# Patient Record
Sex: Male | Born: 1960 | Race: White | Hispanic: No | Marital: Single | Smoking: Current every day smoker
Health system: Southern US, Community
[De-identification: ages and names within clinical notes are randomized; demographics above are authoritative.]

## PROBLEM LIST (undated history)

## (undated) DIAGNOSIS — R519 Headache, unspecified: Secondary | ICD-10-CM

## (undated) DIAGNOSIS — I10 Essential (primary) hypertension: Secondary | ICD-10-CM

## (undated) DIAGNOSIS — L8990 Pressure ulcer of unspecified site, unspecified stage: Secondary | ICD-10-CM

## (undated) DIAGNOSIS — I2510 Atherosclerotic heart disease of native coronary artery without angina pectoris: Secondary | ICD-10-CM

## (undated) DIAGNOSIS — H538 Other visual disturbances: Secondary | ICD-10-CM

## (undated) DIAGNOSIS — Z95828 Presence of other vascular implants and grafts: Secondary | ICD-10-CM

## (undated) HISTORY — DX: Pressure ulcer of unspecified site, unspecified stage: L89.90

## (undated) HISTORY — DX: Presence of other vascular implants and grafts: Z95.828

## (undated) HISTORY — DX: Other visual disturbances: H53.8

## (undated) HISTORY — DX: Headache, unspecified: R51.9

---

## 2010-07-22 ENCOUNTER — Emergency Department: Payer: Self-pay | Admitting: Emergency Medicine

## 2018-06-19 ENCOUNTER — Encounter (HOSPITAL_COMMUNITY): Payer: Self-pay

## 2018-06-19 ENCOUNTER — Emergency Department (HOSPITAL_COMMUNITY): Payer: Medicaid Other | Admitting: Anesthesiology

## 2018-06-19 ENCOUNTER — Emergency Department (HOSPITAL_COMMUNITY): Payer: Medicaid Other

## 2018-06-19 ENCOUNTER — Encounter (HOSPITAL_COMMUNITY)
Admission: EM | Disposition: A | Discharge status: Home / Self Care | Payer: Self-pay | Source: Home / Self Care | Attending: Cardiothoracic Surgery

## 2018-06-19 ENCOUNTER — Inpatient Hospital Stay (HOSPITAL_COMMUNITY)
Admission: EM | Admit: 2018-06-19 | Discharge: 2018-06-28 | DRG: 220 | Disposition: A | Discharge status: Home / Self Care | Payer: Medicaid Other | Attending: Cardiothoracic Surgery | Admitting: Cardiothoracic Surgery

## 2018-06-19 ENCOUNTER — Other Ambulatory Visit: Payer: Self-pay

## 2018-06-19 DIAGNOSIS — I2510 Atherosclerotic heart disease of native coronary artery without angina pectoris: Secondary | ICD-10-CM | POA: Diagnosis present

## 2018-06-19 DIAGNOSIS — Z9689 Presence of other specified functional implants: Secondary | ICD-10-CM

## 2018-06-19 DIAGNOSIS — Z95828 Presence of other vascular implants and grafts: Secondary | ICD-10-CM

## 2018-06-19 DIAGNOSIS — I7101 Dissection of thoracic aorta: Secondary | ICD-10-CM

## 2018-06-19 DIAGNOSIS — G8311 Monoplegia of lower limb affecting right dominant side: Secondary | ICD-10-CM | POA: Diagnosis present

## 2018-06-19 DIAGNOSIS — I959 Hypotension, unspecified: Secondary | ICD-10-CM | POA: Diagnosis present

## 2018-06-19 DIAGNOSIS — F17200 Nicotine dependence, unspecified, uncomplicated: Secondary | ICD-10-CM | POA: Diagnosis present

## 2018-06-19 DIAGNOSIS — G8314 Monoplegia of lower limb affecting left nondominant side: Secondary | ICD-10-CM | POA: Diagnosis present

## 2018-06-19 DIAGNOSIS — D6959 Other secondary thrombocytopenia: Secondary | ICD-10-CM | POA: Diagnosis not present

## 2018-06-19 DIAGNOSIS — L8990 Pressure ulcer of unspecified site, unspecified stage: Secondary | ICD-10-CM

## 2018-06-19 DIAGNOSIS — Z09 Encounter for follow-up examination after completed treatment for conditions other than malignant neoplasm: Secondary | ICD-10-CM

## 2018-06-19 DIAGNOSIS — I10 Essential (primary) hypertension: Secondary | ICD-10-CM | POA: Diagnosis present

## 2018-06-19 DIAGNOSIS — D62 Acute posthemorrhagic anemia: Secondary | ICD-10-CM | POA: Diagnosis not present

## 2018-06-19 DIAGNOSIS — I351 Nonrheumatic aortic (valve) insufficiency: Secondary | ICD-10-CM | POA: Diagnosis present

## 2018-06-19 DIAGNOSIS — I7103 Dissection of thoracoabdominal aorta: Principal | ICD-10-CM | POA: Diagnosis present

## 2018-06-19 DIAGNOSIS — J9811 Atelectasis: Secondary | ICD-10-CM | POA: Diagnosis present

## 2018-06-19 HISTORY — PX: REPLACEMENT ASCENDING AORTA: SHX6068

## 2018-06-19 HISTORY — DX: Essential (primary) hypertension: I10

## 2018-06-19 HISTORY — PX: CORONARY ARTERY BYPASS GRAFT: SHX141

## 2018-06-19 HISTORY — DX: Atherosclerotic heart disease of native coronary artery without angina pectoris: I25.10

## 2018-06-19 HISTORY — PX: INTRAOPERATIVE TRANSESOPHAGEAL ECHOCARDIOGRAM: SHX5062

## 2018-06-19 LAB — BASIC METABOLIC PANEL
Anion gap: 16 — ABNORMAL HIGH (ref 5–15)
BUN: 11 mg/dL (ref 6–20)
CALCIUM: 9 mg/dL (ref 8.9–10.3)
CO2: 18 mmol/L — ABNORMAL LOW (ref 22–32)
CREATININE: 1.58 mg/dL — AB (ref 0.61–1.24)
Chloride: 106 mmol/L (ref 98–111)
GFR calc non Af Amer: 47 mL/min — ABNORMAL LOW (ref >60)
GFR, EST AFRICAN AMERICAN: 54 mL/min — AB (ref >60)
GLUCOSE: 131 mg/dL — AB (ref 70–99)
Potassium: 3.4 mmol/L — ABNORMAL LOW (ref 3.5–5.1)
Sodium: 140 mmol/L (ref 135–145)

## 2018-06-19 LAB — CBC
HCT: 48.1 % (ref 39.0–52.0)
Hemoglobin: 16 g/dL (ref 13.0–17.0)
MCH: 31.1 pg (ref 26.0–34.0)
MCHC: 33.3 g/dL (ref 30.0–36.0)
MCV: 93.6 fL (ref 78.0–100.0)
PLATELETS: 227 10*3/uL (ref 150–400)
RBC: 5.14 MIL/uL (ref 4.22–5.81)
RDW: 12.5 % (ref 11.5–15.5)
WBC: 13.2 10*3/uL — ABNORMAL HIGH (ref 4.0–10.5)

## 2018-06-19 LAB — I-STAT CHEM 8, ED
BUN: 12 mg/dL (ref 6–20)
CALCIUM ION: 1.03 mmol/L — AB (ref 1.15–1.40)
CREATININE: 1.5 mg/dL — AB (ref 0.61–1.24)
Chloride: 108 mmol/L (ref 98–111)
GLUCOSE: 129 mg/dL — AB (ref 70–99)
HCT: 47 % (ref 39.0–52.0)
Hemoglobin: 16 g/dL (ref 13.0–17.0)
Potassium: 3.5 mmol/L (ref 3.5–5.1)
Sodium: 142 mmol/L (ref 135–145)
TCO2: 20 mmol/L — AB (ref 22–32)

## 2018-06-19 LAB — HEPATIC FUNCTION PANEL
ALT: 10 U/L (ref 0–44)
AST: 19 U/L (ref 15–41)
Albumin: 3.7 g/dL (ref 3.5–5.0)
Alkaline Phosphatase: 79 U/L (ref 38–126)
BILIRUBIN DIRECT: 0.2 mg/dL (ref 0.0–0.2)
BILIRUBIN INDIRECT: 0.5 mg/dL (ref 0.3–0.9)
BILIRUBIN TOTAL: 0.7 mg/dL (ref 0.3–1.2)
Total Protein: 6.4 g/dL — ABNORMAL LOW (ref 6.5–8.1)

## 2018-06-19 LAB — PROTIME-INR
INR: 1.11
Prothrombin Time: 14.2 seconds (ref 11.4–15.2)

## 2018-06-19 LAB — ABO/RH: ABO/RH(D): O POS

## 2018-06-19 LAB — I-STAT TROPONIN, ED: TROPONIN I, POC: 0.01 ng/mL (ref 0.00–0.08)

## 2018-06-19 SURGERY — REPLACEMENT, AORTA, ASCENDING
Anesthesia: General | Site: Esophagus

## 2018-06-19 MED ORDER — ONDANSETRON HCL 4 MG/2ML IJ SOLN
INTRAMUSCULAR | Status: AC
  Filled 2018-06-19: qty 2

## 2018-06-19 MED ORDER — HEMOSTATIC AGENTS (NO CHARGE) OPTIME
TOPICAL | Status: DC | PRN
  Administered 2018-06-19: 2 via TOPICAL
  Administered 2018-06-19 (×2): 1 via TOPICAL
  Administered 2018-06-19: 3 via TOPICAL
  Administered 2018-06-20: 1 via TOPICAL
  Administered 2018-06-20: 2 via TOPICAL

## 2018-06-19 MED ORDER — PROPOFOL 10 MG/ML IV BOLUS
INTRAVENOUS | Status: AC
  Filled 2018-06-19: qty 20

## 2018-06-19 MED ORDER — TRANEXAMIC ACID (OHS) BOLUS VIA INFUSION
15.0000 mg/kg | INTRAVENOUS | Status: AC
  Administered 2018-06-19: 1360.5 mg via INTRAVENOUS
  Filled 2018-06-19: qty 1361

## 2018-06-19 MED ORDER — IOPAMIDOL (ISOVUE-370) INJECTION 76%
100.0000 mL | Freq: Once | INTRAVENOUS | Status: AC | PRN
  Administered 2018-06-19: 100 mL via INTRAVENOUS

## 2018-06-19 MED ORDER — MILRINONE LACTATE IN DEXTROSE 20-5 MG/100ML-% IV SOLN
0.3000 ug/kg/min | INTRAVENOUS | Status: DC
  Filled 2018-06-19 (×2): qty 100

## 2018-06-19 MED ORDER — MIDAZOLAM HCL 10 MG/2ML IJ SOLN
INTRAMUSCULAR | Status: AC
  Filled 2018-06-19: qty 2

## 2018-06-19 MED ORDER — FENTANYL CITRATE (PF) 250 MCG/5ML IJ SOLN
INTRAMUSCULAR | Status: AC
  Filled 2018-06-19: qty 20

## 2018-06-19 MED ORDER — MAGNESIUM SULFATE 50 % IJ SOLN
40.0000 meq | INTRAMUSCULAR | Status: DC
  Filled 2018-06-19: qty 9.85

## 2018-06-19 MED ORDER — HEPARIN SODIUM (PORCINE) 1000 UNIT/ML IJ SOLN
INTRAMUSCULAR | Status: DC | PRN
  Administered 2018-06-19: 33000 [IU] via INTRAVENOUS

## 2018-06-19 MED ORDER — TRANEXAMIC ACID 1000 MG/10ML IV SOLN
1.5000 mg/kg/h | INTRAVENOUS | Status: AC
  Administered 2018-06-19: 1.5 mg/kg/h via INTRAVENOUS
  Filled 2018-06-19: qty 25

## 2018-06-19 MED ORDER — SODIUM CHLORIDE 0.9 % IV BOLUS
1000.0000 mL | Freq: Once | INTRAVENOUS | Status: AC
  Administered 2018-06-19: 1000 mL via INTRAVENOUS

## 2018-06-19 MED ORDER — DEXMEDETOMIDINE HCL IN NACL 400 MCG/100ML IV SOLN
0.1000 ug/kg/h | INTRAVENOUS | Status: DC
  Filled 2018-06-19 (×2): qty 100

## 2018-06-19 MED ORDER — LIDOCAINE HCL (CARDIAC) PF 100 MG/5ML IV SOSY
PREFILLED_SYRINGE | INTRAVENOUS | Status: DC | PRN
  Administered 2018-06-19: 40 mg via INTRAVENOUS

## 2018-06-19 MED ORDER — NITROGLYCERIN IN D5W 200-5 MCG/ML-% IV SOLN
2.0000 ug/min | INTRAVENOUS | Status: DC
  Filled 2018-06-19: qty 250

## 2018-06-19 MED ORDER — FENTANYL CITRATE (PF) 250 MCG/5ML IJ SOLN
INTRAMUSCULAR | Status: DC | PRN
  Administered 2018-06-19: 150 ug via INTRAVENOUS
  Administered 2018-06-19: 100 ug via INTRAVENOUS
  Administered 2018-06-20 (×3): 250 ug via INTRAVENOUS

## 2018-06-19 MED ORDER — SODIUM CHLORIDE 0.9 % IV SOLN
INTRAVENOUS | Status: DC | PRN
  Administered 2018-06-19: 1 [IU]/h via INTRAVENOUS

## 2018-06-19 MED ORDER — SODIUM CHLORIDE 0.9 % IV SOLN
INTRAVENOUS | Status: DC
  Filled 2018-06-19: qty 1

## 2018-06-19 MED ORDER — MIDAZOLAM HCL 5 MG/5ML IJ SOLN
INTRAMUSCULAR | Status: DC | PRN
  Administered 2018-06-19: 2 mg via INTRAVENOUS
  Administered 2018-06-19: 3 mg via INTRAVENOUS
  Administered 2018-06-20: 5 mg via INTRAVENOUS
  Administered 2018-06-20: 2 mg via INTRAVENOUS

## 2018-06-19 MED ORDER — IOPAMIDOL (ISOVUE-370) INJECTION 76%
INTRAVENOUS | Status: AC
  Filled 2018-06-19: qty 100

## 2018-06-19 MED ORDER — VANCOMYCIN HCL 10 G IV SOLR
1500.0000 mg | INTRAVENOUS | Status: AC
  Administered 2018-06-19: 1500 mg via INTRAVENOUS
  Filled 2018-06-19: qty 1500

## 2018-06-19 MED ORDER — EPINEPHRINE PF 1 MG/ML IJ SOLN
0.0000 ug/min | INTRAVENOUS | Status: DC
  Filled 2018-06-19: qty 4

## 2018-06-19 MED ORDER — SODIUM CHLORIDE 0.9 % IV SOLN
1.5000 g | INTRAVENOUS | Status: AC
  Administered 2018-06-19: 1.5 g via INTRAVENOUS
  Filled 2018-06-19: qty 1.5

## 2018-06-19 MED ORDER — ESMOLOL HCL-SODIUM CHLORIDE 2000 MG/100ML IV SOLN
25.0000 ug/kg/min | INTRAVENOUS | Status: DC
  Filled 2018-06-19: qty 100

## 2018-06-19 MED ORDER — PLASMA-LYTE 148 IV SOLN
INTRAVENOUS | Status: DC
  Filled 2018-06-19: qty 2.5

## 2018-06-19 MED ORDER — SUCCINYLCHOLINE 20MG/ML (10ML) SYRINGE FOR MEDFUSION PUMP - OPTIME
INTRAMUSCULAR | Status: DC | PRN
  Administered 2018-06-19: 200 mg via INTRAVENOUS

## 2018-06-19 MED ORDER — FENTANYL CITRATE (PF) 100 MCG/2ML IJ SOLN
INTRAMUSCULAR | Status: AC
  Administered 2018-06-19: 50 ug
  Filled 2018-06-19: qty 2

## 2018-06-19 MED ORDER — LACTATED RINGERS IV SOLN
INTRAVENOUS | Status: DC | PRN
  Administered 2018-06-19 – 2018-06-20 (×2): via INTRAVENOUS

## 2018-06-19 MED ORDER — ROCURONIUM BROMIDE 100 MG/10ML IV SOLN
INTRAVENOUS | Status: DC | PRN
  Administered 2018-06-19 – 2018-06-20 (×6): 50 mg via INTRAVENOUS

## 2018-06-19 MED ORDER — SODIUM CHLORIDE 0.9 % IV SOLN
750.0000 mg | INTRAVENOUS | Status: AC
  Administered 2018-06-20: 750 mg via INTRAVENOUS
  Filled 2018-06-19: qty 750

## 2018-06-19 MED ORDER — FENTANYL CITRATE (PF) 100 MCG/2ML IJ SOLN
50.0000 ug | Freq: Once | INTRAMUSCULAR | Status: AC
  Administered 2018-06-19: 50 ug via INTRAVENOUS

## 2018-06-19 MED ORDER — DOPAMINE-DEXTROSE 3.2-5 MG/ML-% IV SOLN
0.0000 ug/kg/min | INTRAVENOUS | Status: DC
  Filled 2018-06-19: qty 250

## 2018-06-19 MED ORDER — PHENYLEPHRINE HCL-NACL 20-0.9 MG/250ML-% IV SOLN
30.0000 ug/min | INTRAVENOUS | Status: DC
  Filled 2018-06-19 (×2): qty 250

## 2018-06-19 MED ORDER — TRANEXAMIC ACID (OHS) PUMP PRIME SOLUTION
2.0000 mg/kg | INTRAVENOUS | Status: DC
  Filled 2018-06-19: qty 1.81

## 2018-06-19 MED ORDER — PROPOFOL 10 MG/ML IV BOLUS
INTRAVENOUS | Status: DC | PRN
  Administered 2018-06-19: 50 mg via INTRAVENOUS
  Administered 2018-06-20: 100 mg via INTRAVENOUS
  Administered 2018-06-20: 50 mg via INTRAVENOUS

## 2018-06-19 MED ORDER — 0.9 % SODIUM CHLORIDE (POUR BTL) OPTIME
TOPICAL | Status: DC | PRN
  Administered 2018-06-19: 5000 mL

## 2018-06-19 MED ORDER — SODIUM CHLORIDE 0.9 % IV SOLN
INTRAVENOUS | Status: DC | PRN
  Administered 2018-06-19: 50 ug/min via INTRAVENOUS

## 2018-06-19 MED ORDER — ETOMIDATE 2 MG/ML IV SOLN
INTRAVENOUS | Status: DC | PRN
  Administered 2018-06-19: 16 mg via INTRAVENOUS

## 2018-06-19 MED ORDER — SODIUM CHLORIDE 0.9 % IV SOLN
INTRAVENOUS | Status: DC
  Filled 2018-06-19: qty 30

## 2018-06-19 MED ORDER — POTASSIUM CHLORIDE 2 MEQ/ML IV SOLN
80.0000 meq | INTRAVENOUS | Status: DC
  Filled 2018-06-19: qty 40

## 2018-06-19 SURGICAL SUPPLY — 100 items
ADAPTER CARDIO PERF ANTE/RETRO (ADAPTER) ×3 IMPLANT
APPLICATOR TIP COSEAL (VASCULAR PRODUCTS) ×3 IMPLANT
BAG DECANTER FOR FLEXI CONT (MISCELLANEOUS) ×3 IMPLANT
BANDAGE ACE 4X5 VEL STRL LF (GAUZE/BANDAGES/DRESSINGS) ×3 IMPLANT
BANDAGE ACE 6X5 VEL STRL LF (GAUZE/BANDAGES/DRESSINGS) ×3 IMPLANT
BLADE STERNUM SYSTEM 6 (BLADE) ×3 IMPLANT
BLADE SURG 15 STRL LF DISP TIS (BLADE) ×2 IMPLANT
BLADE SURG 15 STRL SS (BLADE) ×1
BNDG GAUZE ELAST 4 BULKY (GAUZE/BANDAGES/DRESSINGS) ×3 IMPLANT
CANISTER SUCT 3000ML PPV (MISCELLANEOUS) ×3 IMPLANT
CANNULA GUNDRY RCSP 15FR (MISCELLANEOUS) ×3 IMPLANT
CANNULA SUMP PERICARDIAL (CANNULA) ×3 IMPLANT
CATH CPB KIT GERHARDT (MISCELLANEOUS) ×3 IMPLANT
CATH FOLEY 2WAY SLVR 18FR 30CC (CATHETERS) ×3 IMPLANT
CATH HEART VENT LEFT (CATHETERS) ×2 IMPLANT
CATH THORACIC 28FR (CATHETERS) ×3 IMPLANT
CATH/SQUID NICHOLS JEHLE COR (CATHETERS) IMPLANT
CAUTERY SURG HI TEMP FINE TIP (MISCELLANEOUS) ×3 IMPLANT
CONN 1/4X1/4 STERILE (MISCELLANEOUS) ×3 IMPLANT
CONN ST 1/4X3/8  BEN (MISCELLANEOUS) ×2
CONN ST 1/4X3/8 BEN (MISCELLANEOUS) ×4 IMPLANT
COVER WAND RF STERILE (DRAPES) ×3 IMPLANT
CRADLE DONUT ADULT HEAD (MISCELLANEOUS) ×3 IMPLANT
DERMABOND ADVANCED (GAUZE/BANDAGES/DRESSINGS) ×1
DERMABOND ADVANCED .7 DNX12 (GAUZE/BANDAGES/DRESSINGS) ×2 IMPLANT
DRAIN CHANNEL 28F RND 3/8 FF (WOUND CARE) ×3 IMPLANT
DRAPE CARDIOVASCULAR INCISE (DRAPES) ×1
DRAPE SLUSH/WARMER DISC (DRAPES) IMPLANT
DRAPE SRG 135X102X78XABS (DRAPES) ×2 IMPLANT
DRSG AQUACEL AG ADV 3.5X 4 (GAUZE/BANDAGES/DRESSINGS) ×3 IMPLANT
DRSG AQUACEL AG ADV 3.5X14 (GAUZE/BANDAGES/DRESSINGS) ×3 IMPLANT
ELECT BLADE 4.0 EZ CLEAN MEGAD (MISCELLANEOUS) ×3
ELECT CAUTERY BLADE 6.4 (BLADE) ×3 IMPLANT
ELECT REM PT RETURN 9FT ADLT (ELECTROSURGICAL) ×6
ELECTRODE BLDE 4.0 EZ CLN MEGD (MISCELLANEOUS) ×2 IMPLANT
ELECTRODE REM PT RTRN 9FT ADLT (ELECTROSURGICAL) ×4 IMPLANT
FELT TEFLON 1X6 (MISCELLANEOUS) ×9 IMPLANT
GAUZE SPONGE 4X4 12PLY STRL (GAUZE/BANDAGES/DRESSINGS) ×6 IMPLANT
GLOVE BIO SURGEON STRL SZ 6.5 (GLOVE) ×9 IMPLANT
GOWN STRL REUS W/ TWL LRG LVL3 (GOWN DISPOSABLE) ×8 IMPLANT
GOWN STRL REUS W/TWL LRG LVL3 (GOWN DISPOSABLE) ×4
GRAFT CV 30X8WVN NDL (Graft) ×2 IMPLANT
GRAFT HEMASHIELD 8MM (Graft) ×1 IMPLANT
GRAFT WOVEN D/V 32DX30L (Vascular Products) ×3 IMPLANT
HANDLE STAPLE  ENDO EGIA 4 STD (STAPLE) ×1
HANDLE STAPLE ENDO EGIA 4 STD (STAPLE) ×2 IMPLANT
HEMOSTAT POWDER SURGIFOAM 1G (HEMOSTASIS) ×9 IMPLANT
HEMOSTAT SURGICEL 2X14 (HEMOSTASIS) ×3 IMPLANT
INSERT FOGARTY SM (MISCELLANEOUS) ×3 IMPLANT
INSERT FOGARTY XLG (MISCELLANEOUS) ×3 IMPLANT
IV CATH 18G X1.75 CATHLON (IV SOLUTION) ×3 IMPLANT
KIT BASIN OR (CUSTOM PROCEDURE TRAY) ×3 IMPLANT
KIT SUCTION CATH 14FR (SUCTIONS) ×9 IMPLANT
KIT TURNOVER KIT B (KITS) ×3 IMPLANT
LEAD PACING MYOCARDI (MISCELLANEOUS) ×3 IMPLANT
LINE EXTENSION DELIVERY (MISCELLANEOUS) ×3 IMPLANT
LINE VENT (MISCELLANEOUS) ×3 IMPLANT
NS IRRIG 1000ML POUR BTL (IV SOLUTION) ×15 IMPLANT
PACK E OPEN HEART (SUTURE) ×3 IMPLANT
PACK OPEN HEART (CUSTOM PROCEDURE TRAY) ×3 IMPLANT
PAD ARMBOARD 7.5X6 YLW CONV (MISCELLANEOUS) ×6 IMPLANT
POWDER SURGICEL 3.0 GRAM (HEMOSTASIS) ×6 IMPLANT
RELOAD TRI 2.0 30 VAS GRAY SUL (STAPLE) ×3 IMPLANT
RELOAD TRI 2.0 60 XTHK VAS SUL (STAPLE) IMPLANT
SEALANT SURG COSEAL 8ML (VASCULAR PRODUCTS) ×3 IMPLANT
SET CARDIOPLEGIA MPS 5001102 (MISCELLANEOUS) ×3 IMPLANT
SET VEIN GRAFT PERF (SET/KITS/TRAYS/PACK) ×3 IMPLANT
SPONGE LAP 18X18 RF (DISPOSABLE) ×9 IMPLANT
SPONGE LAP 4X18 RFD (DISPOSABLE) ×3 IMPLANT
SURGIFLO W/THROMBIN 8M KIT (HEMOSTASIS) ×3 IMPLANT
SUT BONE WAX W31G (SUTURE) ×3 IMPLANT
SUT ETHIBOND 2 0 SH (SUTURE) ×1
SUT ETHIBOND 2 0 SH 36X2 (SUTURE) ×2 IMPLANT
SUT MNCRL AB 4-0 PS2 18 (SUTURE) ×3 IMPLANT
SUT PROLENE 3 0 RB 1 (SUTURE) ×3 IMPLANT
SUT PROLENE 3 0 SH 48 (SUTURE) IMPLANT
SUT PROLENE 3 0 SH DA (SUTURE) ×6 IMPLANT
SUT PROLENE 3 0 SH1 36 (SUTURE) ×6 IMPLANT
SUT PROLENE 4 0 RB 1 (SUTURE) ×3
SUT PROLENE 4-0 RB1 .5 CRCL 36 (SUTURE) ×6 IMPLANT
SUT PROLENE 5 0 C 1 36 (SUTURE) ×27 IMPLANT
SUT PROLENE 6 0 C 1 30 (SUTURE) ×9 IMPLANT
SUT PROLENE 6 0 CC (SUTURE) ×6 IMPLANT
SUT STEEL 6MS V (SUTURE) ×3 IMPLANT
SUT STEEL SZ 6 DBL 3X14 BALL (SUTURE) ×3 IMPLANT
SUT VIC AB 1 CTX 18 (SUTURE) ×9 IMPLANT
SUT VIC AB 2-0 CT1 27 (SUTURE) ×1
SUT VIC AB 2-0 CT1 TAPERPNT 27 (SUTURE) ×2 IMPLANT
SUT VIC AB 3-0 X1 27 (SUTURE) ×3 IMPLANT
SYR 10ML KIT SKIN ADHESIVE (MISCELLANEOUS) ×3 IMPLANT
SYSTEM SAHARA CHEST DRAIN ATS (WOUND CARE) ×3 IMPLANT
TABLE PACK (MISCELLANEOUS) ×3 IMPLANT
TOWEL GREEN STERILE (TOWEL DISPOSABLE) ×12 IMPLANT
TOWEL GREEN STERILE FF (TOWEL DISPOSABLE) ×3 IMPLANT
TRAY FOLEY SLVR 16FR TEMP STAT (SET/KITS/TRAYS/PACK) ×3 IMPLANT
TUBE CONNECTING 12X1/4 (SUCTIONS) ×3 IMPLANT
UNDERPAD 30X30 (UNDERPADS AND DIAPERS) ×3 IMPLANT
VENT LEFT HEART 12002 (CATHETERS) ×3
WATER STERILE IRR 1000ML POUR (IV SOLUTION) ×6 IMPLANT
YANKAUER SUCT BULB TIP NO VENT (SUCTIONS) ×3 IMPLANT

## 2018-06-19 NOTE — ED Provider Notes (Addendum)
57 year old male here with acute onset of severe chest pain and now loss of bilateral pulses and lower extremities.  On arrival, concern for aortic dissection.  Blood pressure is at goal, but heart rate mildly elevated in the 80s.  He is having ongoing pain.  Will give a dose of fentanyl.  Patient taken immediately to the CT scanner where preliminary review shows large, type a dissection extending through the aortic arch throughout the lower abdomen.  Stat consult placed to CT surgery as well as vascular surgery.  Type and screen sent.  Pharmacy at bedside. Taken emergently to OR with Dr. Tyrone Sage.  CRITICAL CARE Performed by: Dollene Cleveland   Total critical care time: 35 minutes  Critical care time was exclusive of separately billable procedures and treating other patients.  Critical care was necessary to treat or prevent imminent or life-threatening deterioration.  Critical care was time spent personally by me on the following activities: development of treatment plan with patient and/or surrogate as well as nursing, discussions with consultants, evaluation of patient's response to treatment, examination of patient, obtaining history from patient or surrogate, ordering and performing treatments and interventions, ordering and review of laboratory studies, ordering and review of radiographic studies, pulse oximetry and re-evaluation of patient's condition.    Shaune Pollack, MD 06/19/18 7681    Shaune Pollack, MD 06/19/18 2320

## 2018-06-19 NOTE — ED Provider Notes (Signed)
[facility] EMERGENCY DEPARTMENT Provider Note   CSN: [id number] Arrival date & time: 06/19/18  2020     History   Chief Complaint Chief Complaint  Patient presents with  . Chest Pain    HPI Zyeir Dymek is a 57 y.o. male.  HPI   Patient is a 57 year old male with PMHx of HTN who presents with sudden onset CP and BLE pain at 1930 while siting on his couch.  He stood up to drive himself to the ED and found his legs were too weak to move.  CP is sharp, constant, central, and fells like "a jackhammer is hitting him."  He has decreased sensation and strength in his legs.  No alleviating or aggravating factors. He c/o associated nausea, diaphoresis, and SOB.  Otherwise in normal state of health prior to event this evening.  No falls or trauma.    Past Medical History:  Diagnosis Date  . Coronary artery disease   . Hypertension     Patient Active Problem List   Diagnosis Date Noted  . 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 No pertinent surgical history.      Home Medications    Prior to Admission medications   Medication Sig Start Date End Date Taking? Authorizing Provider  Aspirin-Acetaminophen-Caffeine (GOODY HEADACHE PO) Take 1 packet by mouth as needed (for pain or headaches).   Yes [provider]    Family History History reviewed. No pertinent family history.  Social History Social History   Tobacco Use  . Smoking status: Current Every Day Smoker  . Smokeless tobacco: Never Used  Substance Use Topics  . Alcohol use: Yes  . Drug use: Never     Allergies   Patient has no known allergies.   Review of Systems Review of Systems  Constitutional: Positive for diaphoresis. Negative for chills and fever.  HENT: Negative for sore throat.   Eyes: Negative for pain and visual disturbance.  Respiratory: Positive for shortness of breath. Negative for cough.   Cardiovascular: Positive for chest pain.  Negative for palpitations.  Gastrointestinal: Positive for nausea. Negative for abdominal pain, diarrhea and vomiting.  Genitourinary: Negative for dysuria and hematuria.  Musculoskeletal: Negative for arthralgias and back pain.  Skin: Negative for color change and rash.  Neurological: Positive for light-headedness and numbness (bilateral LE). Negative for seizures and syncope.  All other systems reviewed and are negative.    Physical Exam Updated Vital Signs BP (!) 92/55 (BP Location: Right Arm)   Pulse 82   Temp 98.5 F (36.9 C) (Temporal)   Resp 16   Ht 6\' 2"  (1.88 m)   Wt 90.7 kg Comment: patient reported  SpO2 100%   BMI 25.68 kg/m   Physical Exam  Constitutional: He is oriented to person, place, and time. He appears well-developed and well-nourished. He appears ill. He appears distressed.  Diaphoretic and mottled.  HENT:  Head: Normocephalic and atraumatic.  Eyes: Pupils are equal, round, and reactive to light. Conjunctivae and EOM are normal.  Neck: Normal range of motion. Neck supple. No JVD present.  Cardiovascular: Normal rate and regular rhythm.  Murmur (systolic) heard. Pulses:      Radial pulses are 1+ on the right side, and 2+ on the left side.       Dorsalis pedis pulses are 0 on the right side, and 0 on the left side.  Pulmonary/Chest: Effort normal. No respiratory distress. He has rales (bilateral).  Abdominal: Soft. He exhibits  no distension. There is no tenderness.  Musculoskeletal: He exhibits no edema.  Neurological: He is alert and oriented to person, place, and time.  Decreased strength of BLE as he is unable to range knees or hips.  Decreased sensation over BLE.    Skin: Skin is warm. Capillary refill takes more than 3 seconds. He is diaphoretic.  Delayed capillary refill of BLE (LLE 3-4 sec, RLE 4-5sec).  Psychiatric: He has a normal mood and affect.  Nursing note and vitals reviewed.    ED Treatments / Results  Labs (all labs ordered are  listed, but only abnormal results are displayed) Labs Reviewed  BASIC METABOLIC PANEL - Abnormal; Notable for the following components:      Result Value   Potassium 3.4 (*)    CO2 18 (*)    Glucose, Bld 131 (*)    Creatinine, Ser 1.58 (*)    GFR calc non Af Amer 47 (*)    GFR calc Af Amer 54 (*)    Anion gap 16 (*)    All other components within normal limits  CBC - Abnormal; Notable for the following components:   WBC 13.2 (*)    All other components within normal limits  HEPATIC FUNCTION PANEL - Abnormal; Notable for the following components:   Total Protein 6.4 (*)    All other components within normal limits  FIBRINOGEN - Abnormal; Notable for the following components:   Fibrinogen 121 (*)    All other components within normal limits  PLATELET COUNT - Abnormal; Notable for the following components:   Platelets 85 (*)    All other components within normal limits  HEMOGLOBIN AND HEMATOCRIT, BLOOD - Abnormal; Notable for the following components:   Hemoglobin 9.7 (*)    HCT 29.8 (*)    All other components within normal limits  I-STAT CHEM 8, ED - Abnormal; Notable for the following components:   Creatinine, Ser 1.50 (*)    Glucose, Bld 129 (*)    Calcium, Ion 1.03 (*)    TCO2 20 (*)    All other components within normal limits  POCT I-STAT, CHEM 8 - Abnormal; Notable for the following components:   Glucose, Bld 143 (*)    Calcium, Ion 1.09 (*)    All other components within normal limits  POCT I-STAT 3, ART BLOOD GAS (G3+) - Abnormal; Notable for the following components:   pH, Arterial 7.186 (*)    pCO2 arterial 60.5 (*)    Acid-base deficit 6.0 (*)    All other components within normal limits  POCT I-STAT, CHEM 8 - Abnormal; Notable for the following components:   Potassium 5.2 (*)    Creatinine, Ser 1.30 (*)    Glucose, Bld 159 (*)    Calcium, Ion 1.04 (*)    All other components within normal limits  POCT I-STAT 3, ART BLOOD GAS (G3+) - Abnormal; Notable for  the following components:   pH, Arterial 7.272 (*)    pCO2 arterial 54.2 (*)    pO2, Arterial 525.0 (*)    All other components within normal limits  POCT I-STAT, CHEM 8 - Abnormal; Notable for the following components:   Glucose, Bld 147 (*)    Calcium, Ion 0.94 (*)    Hemoglobin 11.2 (*)    HCT 33.0 (*)    All other components within normal limits  POCT I-STAT 3, ART BLOOD GAS (G3+) - Abnormal; Notable for the following components:   pH, Arterial 7.091 (*)  pCO2 arterial 72.9 (*)    pO2, Arterial 192.0 (*)    Acid-base deficit 8.0 (*)    All other components within normal limits  POCT I-STAT, CHEM 8 - Abnormal; Notable for the following components:   Potassium 6.7 (*)    Creatinine, Ser 1.40 (*)    Glucose, Bld 158 (*)    Calcium, Ion 1.08 (*)    Hemoglobin 10.9 (*)    HCT 32.0 (*)    All other components within normal limits  POCT I-STAT 3, ART BLOOD GAS (G3+) - Abnormal; Notable for the following components:   pH, Arterial 7.217 (*)    pCO2 arterial 57.6 (*)    pO2, Arterial 451.0 (*)    Acid-base deficit 5.0 (*)    All other components within normal limits  POCT I-STAT, CHEM 8 - Abnormal; Notable for the following components:   Creatinine, Ser 1.30 (*)    Glucose, Bld 196 (*)    Calcium, Ion 0.94 (*)    TCO2 20 (*)    Hemoglobin 10.2 (*)    HCT 30.0 (*)    All other components within normal limits  POCT I-STAT 3, ART BLOOD GAS (G3+) - Abnormal; Notable for the following components:   pH, Arterial 7.247 (*)    pCO2 arterial 53.1 (*)    pO2, Arterial 112.0 (*)    Acid-base deficit 4.0 (*)    All other components within normal limits  POCT I-STAT, CHEM 8 - Abnormal; Notable for the following components:   Glucose, Bld 189 (*)    Calcium, Ion 0.72 (*)    Hemoglobin 9.5 (*)    HCT 28.0 (*)    All other components within normal limits  POCT I-STAT 3, ART BLOOD GAS (G3+) - Abnormal; Notable for the following components:   pH, Arterial 7.317 (*)    pCO2 arterial  48.3 (*)    pO2, Arterial 120.0 (*)    All other components within normal limits  POCT I-STAT 7, (LYTES, BLD GAS, ICA,H+H) - Abnormal; Notable for the following components:   pH, Arterial 7.196 (*)    pCO2 arterial 54.3 (*)    pO2, Arterial 73.0 (*)    Acid-base deficit 7.0 (*)    HCT 29.0 (*)    Hemoglobin 9.9 (*)    All other components within normal limits  POCT I-STAT 4, (NA,K, GLUC, HGB,HCT) - Abnormal; Notable for the following components:   Glucose, Bld 246 (*)    HCT 28.0 (*)    Hemoglobin 9.5 (*)    All other components within normal limits  POCT I-STAT 7, (LYTES, BLD GAS, ICA,H+H) - Abnormal; Notable for the following components:   pH, Arterial 7.221 (*)    pCO2 arterial 69.1 (*)    pO2, Arterial 39.0 (*)    Bicarbonate 28.4 (*)    Sodium 146 (*)    Calcium, Ion 0.96 (*)    HCT 35.0 (*)    Hemoglobin 11.9 (*)    All other components within normal limits  POCT I-STAT EG7 - Abnormal; Notable for the following components:   pH, Ven 7.233 (*)    Acid-base deficit 3.0 (*)    Sodium 147 (*)    Potassium 3.4 (*)    Calcium, Ion 1.11 (*)    HCT 32.0 (*)    Hemoglobin 10.9 (*)    All other components within normal limits  POCT I-STAT 7, (LYTES, BLD GAS, ICA,H+H) - Abnormal; Notable for the following components:   pH, Arterial  7.271 (*)    pCO2 arterial 57.2 (*)    pO2, Arterial 117.0 (*)    Calcium, Ion 1.03 (*)    HCT 32.0 (*)    Hemoglobin 10.9 (*)    All other components within normal limits  PROTIME-INR  BLOOD GAS, ARTERIAL  CBC  PROTIME-INR  APTT  BASIC METABOLIC PANEL  MAGNESIUM  I-STAT TROPONIN, ED  TYPE AND SCREEN  ABO/RH  PREPARE FRESH FROZEN PLASMA  PREPARE CRYOPRECIPITATE  PREPARE PLATELET PHERESIS  PREPARE RBC (CROSSMATCH)  PREPARE FRESH FROZEN PLASMA  SURGICAL PATHOLOGY    EKG None  Radiology Dg Chest Portable 1 View  Result Date: 06/19/2018 CLINICAL DATA:  New onset chest pain EXAM: PORTABLE CHEST 1 VIEW COMPARISON:  None. FINDINGS: No  focal opacity or pleural effusion. Heart size is normal. Bilateral hilar enlargement. No pneumothorax. Micro nodularity in the lower lung zones. IMPRESSION: 1. No acute pulmonary infiltrate 2. Bilateral hilar enlargement, question nodes. Recommend contrast-enhanced CT to further evaluate 3. Suspicion of multiple punctate nodules within the lung bases, could also be evaluated at chest CT. Electronically Signed   By: Jasmine Pang M.D.   On: 06/19/2018 20:38   Ct Angio Chest/abd/pel For Dissection W And/or W/wo  Result Date: 06/19/2018 CLINICAL DATA:  Acute onset chest pain concern for dissection EXAM: CT ANGIOGRAPHY CHEST, ABDOMEN AND PELVIS TECHNIQUE: Multidetector CT imaging through the chest, abdomen and pelvis was performed using the standard protocol during bolus administration of intravenous contrast. Multiplanar reconstructed images and MIPs were obtained and reviewed to evaluate the vascular anatomy. CONTRAST:  100 cc Isovue 370 COMPARISON:  06/19/2018 chest x-ray FINDINGS: CTA CHEST FINDINGS Cardiovascular: There is an acute type A dissection of the thoracic aorta beginning at the aortic root and involving the entire thoracic aorta and extending into the abdomen. Both true and false lumens remain patent. Three-vessel arch anatomy remaining patent off of the true lumen. Dissection extends into the right brachiocephalic artery to the bifurcation of the right subclavian and right common carotid artery. There is acute mediastinal hematoma and acute mediastinal active bleeding medially along the ascending thoracic aorta, image 63 series 6. Mediastinal hematoma compresses the main pulmonary arteries. There is also a small pericardial effusion. Normal heart size. Mediastinum/Nodes: Thyroid, trachea and esophagus demonstrate no acute finding. Hilar adenopathy suspected bilaterally. Right hilar lymph nodes are calcified compatible with remote granulomatous disease. Lungs/Pleura: Mild pulmonary emphysema.  Dependent hypoventilatory changes. No acute airspace process, collapse or consolidation. No edema airspace disease. No pleural abnormality, effusion or pneumothorax. Musculoskeletal: Degenerative changes noted of the spine. Chronic lower thoracic mild compression deformities. No definite acute compression fracture. Sternum intact. Review of the MIP images confirms the above findings. CTA ABDOMEN AND PELVIS FINDINGS VASCULAR Aorta: Acute dissection extends into the abdominal aorta. The true lumen is the smaller lumen. Infrarenal aorta is poorly opacified related to significant compression of the true lumen by the false lumen. Dissection extends to the bifurcation. No retroperitoneal hematoma. Celiac: Remains patent including its branches off the true lumen. SMA: Remains patent including its branches off the true lumen. Renals: Right renal artery remains patent off the true lumen. Left renal artery has decreased perfusion from the false lumen. IMA: IMA has decreased perfusion from the false lumen. Inflow: There is compromise of the iliac inflow secondary to extension of the dissection to the bifurcation. There is contrast opacification within the right common, internal, external iliac arteries. Right iliac system is likely still supplied by the true lumen. Left iliac system is  not opacified and likely secondary to hypoperfusion from the false lumen. Veins: Dedicated venous imaging not performed Review of the MIP images confirms the above findings. NON-VASCULAR Hepatobiliary: No focal liver abnormality is seen. No gallstones, gallbladder wall thickening, or biliary dilatation. Pancreas: Unremarkable. No pancreatic ductal dilatation or surrounding inflammatory changes. Spleen: Normal in size without focal abnormality. Adrenals/Urinary Tract: Adrenal glands unremarkable. Right kidney hypodense cysts noted, largest measures 3 cm posteriorly. Left kidney is hypoperfused secondary to the left renal artery originate from the  false lumen. Left renal cysts also suspected. No renal obstruction or hydronephrosis. No hydroureter, ureteral calculus, or definite bladder abnormality. Bladder is underdistended. Stomach/Bowel: Negative for bowel obstruction, significant dilatation, ileus, free air. No ascites or hemoperitoneum. Normal appearing appendix. Colonic diverticulosis noted distally. No acute inflammatory process, fluid collection, or abscess. Lymphatic: No adenopathy Reproductive: Unremarkable Other: Inguinal hernia repairs bilaterally. No recurrent hernia. Intact abdominal wall. Musculoskeletal: No acute osseous finding. Degenerative changes of the spine. Review of the MIP images confirms the above findings. 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 Mediastinal hematoma compresses the pulmonary outflow tract and the main pulmonary arteries bilaterally. There is associated small pericardial effusion. Hypoperfusion of the left kidney and compromised left iliac inflow secondary to extension of the dissection to the bifurcation. These results were called by telephone at the time of interpretation on 06/19/2018 at 9:15 pm to Dr. Shaune Pollack , who verbally acknowledged these results. Electronically Signed   By: Judie Petit.  Shick M.D.   On: 06/19/2018 21:49    Procedures Procedures (including critical care time)  Medications Ordered in ED Medications  ondansetron (ZOFRAN) 4 MG/2ML injection (has no administration in time range)  0.45 % sodium chloride infusion (has no administration in time range)  lactated ringers infusion (has no administration in time range)  lactated ringers infusion (has no administration in time range)  sodium chloride flush (NS) 0.9 % injection 3 mL (has no administration in time range)  sodium chloride flush (NS) 0.9 % injection 3 mL (has no administration in time range)  0.9 %   sodium chloride infusion ( Intravenous Anesthesia Volume Adjustment 06/20/18 0718)  0.9 %  sodium chloride infusion (has no administration in time range)  insulin regular (NOVOLIN R,HUMULIN R) 100 Units in sodium chloride 0.9 % 100 mL (1 Units/mL) infusion (has no administration in time range)  insulin regular bolus via infusion 0-10 Units (0 Units Intravenous Not Given 06/20/18 0748)  nitroGLYCERIN 50 mg in dextrose 5 % 250 mL (0.2 mg/mL) infusion (has no administration in time range)  phenylephrine (NEOSYNEPHRINE) 20-0.9 MG/250ML-% infusion (has no administration in time range)  lactated ringers infusion 500 mL (has no administration in time range)  albumin human 5 % solution 12.5 g (has no administration in time range)  potassium chloride 10 mEq in 50 mL *CENTRAL LINE* IVPB (has no administration in time range)  magnesium sulfate IVPB 4 g 100 mL (has no administration in time range)  cefUROXime (ZINACEF) 1.5 g in sodium chloride 0.9 % 100 mL IVPB (has no administration in time range)  vancomycin (VANCOCIN) IVPB 1000 mg/200 mL premix (has no administration in time range)  acetaminophen (TYLENOL) solution 650 mg (has no administration in time range)    Or  acetaminophen (TYLENOL) suppository 650 mg (has no administration in time range)  acetaminophen (TYLENOL) tablet 1,000 mg (has no administration in time range)    Or  acetaminophen (  TYLENOL) solution 1,000 mg (has no administration in time range)  traMADol (ULTRAM) tablet 50-100 mg (has no administration in time range)  oxyCODONE (Oxy IR/ROXICODONE) immediate release tablet 5-10 mg (has no administration in time range)  morphine 2 MG/ML injection 2-5 mg (has no administration in time range)  midazolam (VERSED) injection 2 mg (has no administration in time range)  docusate sodium (COLACE) capsule 200 mg (has no administration in time range)  bisacodyl (DULCOLAX) EC tablet 10 mg (has no administration in time range)    Or  bisacodyl  (DULCOLAX) suppository 10 mg (has no administration in time range)  ondansetron (ZOFRAN) injection 4 mg (has no administration in time range)  aspirin EC tablet 325 mg (has no administration in time range)    Or  aspirin chewable tablet 324 mg (has no administration in time range)  metoprolol tartrate (LOPRESSOR) injection 2.5-5 mg (has no administration in time range)  metoprolol tartrate (LOPRESSOR) tablet 12.5 mg (has no administration in time range)    Or  metoprolol tartrate (LOPRESSOR) 25 mg/10 mL oral suspension 12.5 mg (has no administration in time range)  famotidine (PEPCID) IVPB 20 mg premix (has no administration in time range)  pantoprazole (PROTONIX) EC tablet 40 mg (has no administration in time range)  chlorhexidine (PERIDEX) 0.12 % solution 15 mL (has no administration in time range)  morphine 2 MG/ML injection 1-4 mg (has no administration in time range)  dexmedetomidine (PRECEDEX) 200 MCG/50ML (4 mcg/mL) infusion (has no administration in time range)  DOPamine (INTROPIN) 800 mg in dextrose 5 % 250 mL (3.2 mg/mL) infusion (has no administration in time range)  metoCLOPramide (REGLAN) injection 10 mg (has no administration in time range)  0.9 %  sodium chloride infusion (Manually program via Guardrails IV Fluids) (has no administration in time range)  fentaNYL (SUBLIMAZE) 100 MCG/2ML injection (50 mcg  Given 06/19/18 2037)  iopamidol (ISOVUE-370) 76 % injection 100 mL (100 mLs Intravenous Contrast Given 06/19/18 2039)  sodium chloride 0.9 % bolus 1,000 mL (0 mLs Intravenous Stopped 06/19/18 2057)  sodium chloride 0.9 % bolus 1,000 mL (0 mLs Intravenous Stopped 06/19/18 2109)  fentaNYL (SUBLIMAZE) injection 50 mcg (50 mcg Intravenous Given 06/19/18 2115)  tranexamic acid (CYKLOKAPRON) bolus via infusion - over 30 minutes 1,360.5 mg (1,360.5 mg Intravenous Given 06/19/18 2241)  tranexamic acid (CYKLOKAPRON) 2,500 mg in sodium chloride 0.9 % 250 mL (10 mg/mL) infusion (1.5 mg/kg/hr   90.7 kg Intravenous Restarted 06/20/18 0236)  vancomycin (VANCOCIN) 1,500 mg in sodium chloride 0.9 % 250 mL IVPB (1,500 mg Intravenous New Bag/Given 06/19/18 2250)  cefUROXime (ZINACEF) 1.5 g in sodium chloride 0.9 % 100 mL IVPB (1.5 g Intravenous New Bag/Given 06/19/18 2250)  cefUROXime (ZINACEF) 750 mg in sodium chloride 0.9 % 100 mL IVPB (750 mg Intravenous New Bag/Given 06/20/18 0442)  coagulation factor VIIa recomb (NOVOSEVEN) injection 8,000 mcg (8 mg Intravenous Given 06/20/18 0454)     Initial Impression / Assessment and Plan / ED Course  I have reviewed the triage vital signs and the nursing notes.  Pertinent labs & imaging results that were available during my care of the patient were reviewed by me and considered in my medical decision making (see chart for details).    Patient is a 57 year old male with PMHx of HTN who presents with sudden onset CP and BLE pain with numbness at 1930 while siting on his couch.  No trauma or falls.  On arrival he is in moderate distress, diaphoretic, and mottled.  BP  soft at 90 systolic and HR 90s.  Patient with pulse and BP discrepancy.  Unable to palpate DP pulses.  Immediate concern for aortic dissection and bedside US demonstrated false lumen.  Bedside CXR with questionable mediastinal widening. He was given fentanyl for pain and immediately taken to CT.  CTA demonstrated acute type A thoracoabdominal aortic dissection with mediastinal hematoma and active bleeding.  Small pericardial effusion present.  Hypoperfusion of left kidney with extension of the dissection to the bifurcation.    Cardiothoracic and vascular surgery emergently consulted and immediately at bedside.  Type and screen sent.  Patient taken to the OR as an E1 for repair with Dr. Tyrone Sage.  Final Clinical Impressions(s) / ED Diagnoses   Final diagnoses:  S/P ascending aortic replacement  S/P ascending aortic replacement    ED Discharge Orders    None       Abelardo Diesel,  MD 06/20/18 0830    Shaune Pollack, MD 06/20/18 1152

## 2018-06-19 NOTE — Anesthesia Procedure Notes (Signed)
Arterial Line Insertion Start/End10/09/2017 9:45 PM, 06/19/2018 9:50 PM Performed by: Kipp Brood, MD, Maness, Howie Ill, CRNA, CRNA  Preanesthetic checklist: patient identified, IV checked, monitors and equipment checked and pre-op evaluation Patient sedated Right, radial was placed Catheter size: 20 G Hand hygiene performed  and maximum sterile barriers used   Attempts: 1 Procedure performed without using ultrasound guided technique. Ultrasound Notes:anatomy identified Following insertion, dressing applied and Biopatch.

## 2018-06-19 NOTE — Anesthesia Procedure Notes (Signed)
Arterial Line Insertion Start/End10/09/2017 10:00 PM, 06/19/2018 10:03 PM Performed by: Kipp Brood, MD, Maness, Howie Ill, CRNA, CRNA  Preanesthetic checklist: patient identified, IV checked, monitors and equipment checked and pre-op evaluation Left, radial was placed Catheter size: 20 G Hand hygiene performed  and maximum sterile barriers used   Attempts: 1 Procedure performed without using ultrasound guided technique. Ultrasound Notes:anatomy identified Following insertion, dressing applied and Biopatch.

## 2018-06-19 NOTE — Progress Notes (Signed)
Patient arrived to OR alert and oriented. Patient able to confirm name, DOB, procedure, NKDA, npo since 1600 and metal in left ankle. Patient reports pain in bilateral legs and chest. Patient moved over to OR table with maximum assistance.   Yvetta Coder, RN

## 2018-06-19 NOTE — ED Triage Notes (Signed)
Pt here with new onset chest pain.  Pt stood up and felt a burning in his legs.  Now pt reports burning in legs and unable to palpate pulses in bilateral feet.  Denies any belly pain.

## 2018-06-19 NOTE — Anesthesia Procedure Notes (Addendum)
Procedure Name: Intubation Date/Time: 06/19/2018 10:02 PM Performed by: Melina Schools, CRNA Pre-anesthesia Checklist: Patient identified, Emergency Drugs available, Suction available, Patient being monitored and Timeout performed Patient Re-evaluated:Patient Re-evaluated prior to induction Oxygen Delivery Method: Circle System Utilized Preoxygenation: Pre-oxygenation with 100% oxygen Induction Type: IV induction, Rapid sequence and Cricoid Pressure applied Ventilation: Two handed mask ventilation required, Oral airway inserted - appropriate to patient size and Mask ventilation with difficulty Laryngoscope Size: 4 and Glidescope Grade View: Grade III Tube type: Subglottic suction tube Tube size: 8.5 mm Number of attempts: 4 Airway Equipment and Method: Stylet and Oral airway Placement Confirmation: ETT inserted through vocal cords under direct vision,  positive ETCO2 and breath sounds checked- equal and bilateral Secured at: 24 cm Tube secured with: Tape Difficulty Due To: Difficult Airway- due to anterior larynx and Difficult Airway- due to limited oral opening Comments: Poor dentition noted pre-op, tooth dislodged during intubation

## 2018-06-19 NOTE — ED Notes (Signed)
Patient signed consent form for surgery.

## 2018-06-19 NOTE — Progress Notes (Signed)
  Echocardiogram Echocardiogram Transesophageal has been performed.  Janalyn Harder 06/19/2018, 11:04 PM

## 2018-06-19 NOTE — ED Notes (Addendum)
Dr. Tyrone Sage at bedside evaluating pt. He explained tests results/surgery and plan of care to pt.

## 2018-06-19 NOTE — H&P (Signed)
[address]       [place] [address]             [phone]        Lycan Davee [facility] Medical Record #[id number] Date of Birth: October 02, 1960  Referring: No ref. provider found Primary Care: No primary care provider on file. Primary Cardiologist:No primary care provider on file.  Chief Complaint:    Chief Complaint  Patient presents with  . Chest Pain    History of Present Illness:     Patient with no previous cardiac history other than hypertension noted while lying on the sofa approximately 730pm  sudden onset of chest pain.  He stood to drive himself to the emergency room found that his legs were very weak and he had to lay back down.  He now presents to the [facility] emergency room, with ongoing chest pain and poor motion of his lower extremities with numbness.  His upper body is mottled.  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  He has no family history of aortic dissection.  Only other previous history is significant for previous ulcer disease Current Activity/ Functional Status: Patient was independent with mobility/ambulation, transfers, ADL's, IADL's.   Zubrod Score: At the time of surgery this patient's most appropriate activity status/level should be described as: []     0    Normal activity, no symptoms [x]     1    Restricted in physical strenuous activity but ambulatory, able to do out light work []     2    Ambulatory and capable of self care, unable to do work activities, up and about                 more than 50%  Of the time                            []     3    Only limited self care, in bed greater than 50% of waking hours []     4    Completely disabled, no self care, confined to bed or chair []     5    Moribund  Past Medical History:  Diagnosis Date  . Coronary artery disease   . Hypertension     History reviewed. No pertinent surgical history.  Social History   Tobacco Use  Smoking Status Current Every Day Smoker  Smokeless Tobacco  Never Used   Social History   Substance and Sexual Activity  Alcohol Use Yes     No Known Allergies  Current Facility-Administered Medications  Medication Dose Route Frequency Provider Last Rate Last Dose  . cefUROXime (ZINACEF) 1.5 g in sodium chloride 0.9 % 100 mL IVPB  1.5 g Intravenous To OR Shaune Pollack, MD      . cefUROXime (ZINACEF) 750 mg in sodium chloride 0.9 % 100 mL IVPB  750 mg Intravenous To OR Shaune Pollack, MD      . dexmedetomidine (PRECEDEX) 400 MCG/100ML (4 mcg/mL) infusion  0.1-0.7 mcg/kg/hr Intravenous To OR Shaune Pollack, MD      . DOPamine (INTROPIN) 800 mg in dextrose 5 % 250 mL (3.2 mg/mL) infusion  0-10 mcg/kg/min Intravenous To OR Shaune Pollack, MD      . EPINEPHrine (ADRENALIN) 4 mg in dextrose 5 % 250 mL (0.016 mg/mL) infusion  0-10 mcg/min Intravenous To OR Shaune Pollack, MD      . esmolol (BREVIBLOC) 2000 mg /  100 mL (20 mg/mL) infusion  25-300 mcg/kg/min Intravenous Titrated Rice, Erika, MD      . heparin 2,500 Units, papaverine 30 mg in electrolyte-148 (PLASMALYTE-148) 500 mL irrigation   Irrigation To OR Shaune Pollack, MD      . heparin 30,000 units/NS 1000 mL solution for CELLSAVER   Other To OR Shaune Pollack, MD      . insulin regular (NOVOLIN R,HUMULIN R) 100 Units in sodium chloride 0.9 % 100 mL (1 Units/mL) infusion   Intravenous To OR Shaune Pollack, MD      . magnesium sulfate (IV Push/IM) injection 40 mEq  40 mEq Other To OR Shaune Pollack, MD      . milrinone (PRIMACOR) 20 MG/100 ML (0.2 mg/mL) infusion  0.3 mcg/kg/min Intravenous To OR Shaune Pollack, MD      . nitroGLYCERIN 50 mg in dextrose 5 % 250 mL (0.2 mg/mL) infusion  2-200 mcg/min Intravenous To OR Shaune Pollack, MD      . phenylephrine (NEOSYNEPHRINE) 20-0.9 MG/250ML-% infusion  30-200 mcg/min Intravenous To OR Shaune Pollack, MD      . potassium chloride injection 80 mEq  80 mEq Other To OR Shaune Pollack, MD      . tranexamic acid (CYKLOKAPRON) 2,500 mg in sodium  chloride 0.9 % 250 mL (10 mg/mL) infusion  1.5 mg/kg/hr Intravenous To OR Shaune Pollack, MD      . tranexamic acid (CYKLOKAPRON) bolus via infusion - over 30 minutes 1,360.5 mg  15 mg/kg Intravenous To OR Shaune Pollack, MD      . tranexamic acid (CYKLOKAPRON) pump prime solution 181 mg  2 mg/kg Intracatheter To OR Shaune Pollack, MD      . vancomycin (VANCOCIN) 1,500 mg in sodium chloride 0.9 % 250 mL IVPB  1,500 mg Intravenous To OR Shaune Pollack, MD       Current Outpatient Medications  Medication Sig Dispense Refill  . Aspirin-Acetaminophen-Caffeine (GOODY HEADACHE PO) Take 1 packet by mouth as needed (for pain or headaches).       (Not in a hospital admission)  History reviewed. No pertinent family history.   Review of Systems:   ROS Pertinent items are noted in HPI.     Cardiac Review of Systems: Y or  [    ]= no  Chest Pain [  y  ]  Resting SOB [  y ] Exertional SOB  [ y ]  Orthopnea [ y ]   Pedal Edema [n   ]    Palpitations [n  ] Syncope  [ n ]   Presyncope [ n  ]  General Review of Systems: [Y] = yes [  ]=no Constitional: recent weight change [  ]; anorexia [  ]; fatigue [  ]; nausea [  ]; night sweats [  ]; fever [  ]; or chills [  ]                                                               Dental: Last Dentist visit:   Eye : blurred vision [  ]; diplopia [   ]; vision changes [  ];  Amaurosis fugax[  ]; Resp: cough [  ];  wheezing[  ];  hemoptysis[  ]; shortness of breath[  ]; paroxysmal nocturnal dyspnea[  ];  dyspnea on exertion[  ]; or orthopnea[  ];  GI:  gallstones[  ], vomiting[  ];  dysphagia[  ]; melena[  ];  hematochezia [  ]; heartburn[  ];   Hx of  Colonoscopy[  ]; GU: kidney stones [  ]; hematuria[  ];   dysuria [  ];  nocturia[  ];  history of     obstruction [  ]; urinary frequency [  ]             Skin: rash, swelling[  ];, hair loss[  ];  peripheral edema[  ];  or itching[  ]; Musculosketetal: myalgias[  ];  joint swelling[  ];  joint erythema[   ];  joint pain[  ];  back pain[  ];  Heme/Lymph: bruising[  ];  bleeding[  ];  anemia[  ];  Neuro: TIA[  ];  headaches[  ];  stroke[  ];  vertigo[  ];  seizures[  ];   paresthesias[  ];  difficulty walking[ y ];  Psych:depression[  ]; anxiety[  ];  Endocrine: diabetes[  ];  thyroid dysfunction[  ];               Physical Exam: BP (!) 142/68 (BP Location: Left Arm)   Pulse 73   Temp 98.5 F (36.9 C) (Temporal)   Resp (!) 21   Wt 90.7 kg Comment: patient reported  SpO2 94%    General appearance: alert, appears older than stated age and moderate distress Head: Normocephalic, without obvious abnormality, atraumatic Neck: no adenopathy, no carotid bruit, no JVD, supple, symmetrical, trachea midline and thyroid not enlarged, symmetric, no tenderness/mass/nodules Lymph nodes: Cervical, supraclavicular, and axillary nodes normal. Resp: diminished breath sounds bibasilar Back: symmetric, no curvature. ROM normal. No CVA tenderness. Cardio: systolic murmur: early systolic 2/6, crescendo at 2nd right intercostal space GI: soft, non-tender; bowel sounds normal; no masses,  no organomegaly Extremities: extremities normal, atraumatic, no cyanosis or edema Neurologic: Patient is awake and alert and able to give [place] a history, notes that both legs are numb left greater than right, he has faintly palpable pulses slightly greater in the right leg than the left, he can dorsiflex his ankles but is unable to lift his legs or bend his knees bilaterally Patient does have fairly brisk capillary refill to both feet right slightly faster than left  Diagnostic Studies & Laboratory data:     Recent Radiology Findings:   Dg Chest Portable 1 View  Result Date: 06/19/2018 CLINICAL DATA:  New onset chest pain EXAM: PORTABLE CHEST 1 VIEW COMPARISON:  None. FINDINGS: No focal opacity or pleural effusion. Heart size is normal. Bilateral hilar enlargement. No pneumothorax. Micro nodularity in the lower lung zones.  IMPRESSION: 1. No acute pulmonary infiltrate 2. Bilateral hilar enlargement, question nodes. Recommend contrast-enhanced CT to further evaluate 3. Suspicion of multiple punctate nodules within the lung bases, could also be evaluated at chest CT. Electronically Signed   By: Jasmine Pang M.D.   On: 06/19/2018 20:38   CTA of the chest abdomen pelvis has been performed, radiology has not read it yet shows a type I aortic dissection with compromise of the distal aorta with barely trickle flow into the abdomen and pelvis contrast does enter the right kidney but not the left I have independently reviewed the above radiologic studies and discussed with the patient   Recent Lab Findings: Lab Results  Component Value Date   WBC 13.2 (H) 06/19/2018   HGB 16.0 06/19/2018  HGB 16.0 06/19/2018   HCT 48.1 06/19/2018   HCT 47.0 06/19/2018   PLT 227 06/19/2018   GLUCOSE 129 (H) 06/19/2018   NA 142 06/19/2018   K 3.5 06/19/2018   CL 108 06/19/2018   CREATININE 1.50 (H) 06/19/2018   BUN 12 06/19/2018      Assessment / Plan:   Acute type I aortic dissection, 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  I discussed with the patient his critical injury with type I aortic dissection and need for urgent repair.  He understands this is critical situation and needs to move urgently.  I discussed with him the magnitude of the operation the possible not surviving the operation or having significant spinal cord injury.  The goals risks and alternatives of the planned surgical procedure Procedure(s): ASCENDING AORTIC DISECTION (N/A)  have been discussed with the patient in detail. The risks of the procedure including death, infection, stroke, myocardial infarction, bleeding, blood transfusion have all been discussed specifically.  I have quoted Rachael Darby a 40 % of perioperative mortality and a complication rate as high as 90%. The patient's questions have been  answered.Omero Gobble is willing  to proceed with the planned procedure.    Delight Ovens MD      [address] [place] [place] [address] Office [phone]   Beeper [phone]  06/19/2018 9:28 PM

## 2018-06-19 NOTE — ED Notes (Signed)
Transported to OR

## 2018-06-19 NOTE — ED Notes (Signed)
Patient transported to CT scan . 

## 2018-06-20 ENCOUNTER — Inpatient Hospital Stay (HOSPITAL_COMMUNITY): Payer: Medicaid Other

## 2018-06-20 DIAGNOSIS — I495 Sick sinus syndrome: Secondary | ICD-10-CM | POA: Diagnosis not present

## 2018-06-20 DIAGNOSIS — I2510 Atherosclerotic heart disease of native coronary artery without angina pectoris: Secondary | ICD-10-CM | POA: Diagnosis present

## 2018-06-20 DIAGNOSIS — I714 Abdominal aortic aneurysm, without rupture: Secondary | ICD-10-CM | POA: Diagnosis not present

## 2018-06-20 DIAGNOSIS — Z95828 Presence of other vascular implants and grafts: Secondary | ICD-10-CM

## 2018-06-20 DIAGNOSIS — F17200 Nicotine dependence, unspecified, uncomplicated: Secondary | ICD-10-CM | POA: Diagnosis present

## 2018-06-20 DIAGNOSIS — D6959 Other secondary thrombocytopenia: Secondary | ICD-10-CM | POA: Diagnosis not present

## 2018-06-20 DIAGNOSIS — I361 Nonrheumatic tricuspid (valve) insufficiency: Secondary | ICD-10-CM | POA: Diagnosis not present

## 2018-06-20 DIAGNOSIS — L8990 Pressure ulcer of unspecified site, unspecified stage: Secondary | ICD-10-CM | POA: Diagnosis present

## 2018-06-20 DIAGNOSIS — G8311 Monoplegia of lower limb affecting right dominant side: Secondary | ICD-10-CM | POA: Diagnosis present

## 2018-06-20 DIAGNOSIS — I959 Hypotension, unspecified: Secondary | ICD-10-CM | POA: Diagnosis present

## 2018-06-20 DIAGNOSIS — I7100 Dissection of unspecified site of aorta: Secondary | ICD-10-CM | POA: Diagnosis not present

## 2018-06-20 DIAGNOSIS — I351 Nonrheumatic aortic (valve) insufficiency: Secondary | ICD-10-CM | POA: Diagnosis present

## 2018-06-20 DIAGNOSIS — G8314 Monoplegia of lower limb affecting left nondominant side: Secondary | ICD-10-CM | POA: Diagnosis present

## 2018-06-20 DIAGNOSIS — D62 Acute posthemorrhagic anemia: Secondary | ICD-10-CM | POA: Diagnosis not present

## 2018-06-20 DIAGNOSIS — J9811 Atelectasis: Secondary | ICD-10-CM | POA: Diagnosis present

## 2018-06-20 DIAGNOSIS — I10 Essential (primary) hypertension: Secondary | ICD-10-CM | POA: Diagnosis present

## 2018-06-20 DIAGNOSIS — I7103 Dissection of thoracoabdominal aorta: Secondary | ICD-10-CM | POA: Diagnosis present

## 2018-06-20 DIAGNOSIS — Z720 Tobacco use: Secondary | ICD-10-CM | POA: Diagnosis not present

## 2018-06-20 HISTORY — DX: Presence of other vascular implants and grafts: Z95.828

## 2018-06-20 LAB — CBC
HCT: 35.4 % — ABNORMAL LOW (ref 39.0–52.0)
HEMATOCRIT: 37 % — AB (ref 39.0–52.0)
Hemoglobin: 12.1 g/dL — ABNORMAL LOW (ref 13.0–17.0)
Hemoglobin: 11.9 g/dL — ABNORMAL LOW (ref 13.0–17.0)
MCH: 30.2 pg (ref 26.0–34.0)
MCH: 30.3 pg (ref 26.0–34.0)
MCHC: 32.7 g/dL (ref 30.0–36.0)
MCHC: 33.6 g/dL (ref 30.0–36.0)
MCV: 92.3 fL (ref 78.0–100.0)
MCV: 90.1 fL (ref 78.0–100.0)
PLATELETS: 96 10*3/uL — AB (ref 150–400)
Platelets: 94 10*3/uL — ABNORMAL LOW (ref 150–400)
RBC: 4.01 MIL/uL — AB (ref 4.22–5.81)
RBC: 3.93 MIL/uL — ABNORMAL LOW (ref 4.22–5.81)
RDW: 14.3 % (ref 11.5–15.5)
RDW: 14.6 % (ref 11.5–15.5)
WBC: 19.6 10*3/uL — ABNORMAL HIGH (ref 4.0–10.5)
WBC: 12 10*3/uL — ABNORMAL HIGH (ref 4.0–10.5)

## 2018-06-20 LAB — POCT I-STAT, CHEM 8
BUN: 13 mg/dL (ref 6–20)
BUN: 10 mg/dL (ref 6–20)
BUN: 11 mg/dL (ref 6–20)
BUN: 12 mg/dL (ref 6–20)
BUN: 13 mg/dL (ref 6–20)
BUN: 12 mg/dL (ref 6–20)
BUN: 11 mg/dL (ref 6–20)
CALCIUM ION: 0.72 mmol/L — AB (ref 1.15–1.40)
CALCIUM ION: 1.04 mmol/L — AB (ref 1.15–1.40)
CHLORIDE: 106 mmol/L (ref 98–111)
CHLORIDE: 109 mmol/L (ref 98–111)
CHLORIDE: 110 mmol/L (ref 98–111)
CREATININE: 1.4 mg/dL — AB (ref 0.61–1.24)
CREATININE: 1.2 mg/dL (ref 0.61–1.24)
Calcium, Ion: 0.94 mmol/L — ABNORMAL LOW (ref 1.15–1.40)
Calcium, Ion: 0.94 mmol/L — ABNORMAL LOW (ref 1.15–1.40)
Calcium, Ion: 1.08 mmol/L — ABNORMAL LOW (ref 1.15–1.40)
Calcium, Ion: 1.09 mmol/L — ABNORMAL LOW (ref 1.15–1.40)
Calcium, Ion: 1.18 mmol/L (ref 1.15–1.40)
Chloride: 108 mmol/L (ref 98–111)
Chloride: 106 mmol/L (ref 98–111)
Chloride: 103 mmol/L (ref 98–111)
Chloride: 106 mmol/L (ref 98–111)
Creatinine, Ser: 1 mg/dL (ref 0.61–1.24)
Creatinine, Ser: 1.2 mg/dL (ref 0.61–1.24)
Creatinine, Ser: 1.3 mg/dL — ABNORMAL HIGH (ref 0.61–1.24)
Creatinine, Ser: 1.3 mg/dL — ABNORMAL HIGH (ref 0.61–1.24)
Creatinine, Ser: 1 mg/dL (ref 0.61–1.24)
GLUCOSE: 158 mg/dL — AB (ref 70–99)
GLUCOSE: 196 mg/dL — AB (ref 70–99)
GLUCOSE: 189 mg/dL — AB (ref 70–99)
Glucose, Bld: 143 mg/dL — ABNORMAL HIGH (ref 70–99)
Glucose, Bld: 147 mg/dL — ABNORMAL HIGH (ref 70–99)
Glucose, Bld: 159 mg/dL — ABNORMAL HIGH (ref 70–99)
Glucose, Bld: 103 mg/dL — ABNORMAL HIGH (ref 70–99)
HCT: 28 % — ABNORMAL LOW (ref 39.0–52.0)
HCT: 39 % (ref 39.0–52.0)
HCT: 32 % — ABNORMAL LOW (ref 39.0–52.0)
HEMATOCRIT: 30 % — AB (ref 39.0–52.0)
HEMATOCRIT: 32 % — AB (ref 39.0–52.0)
HEMATOCRIT: 33 % — AB (ref 39.0–52.0)
HEMATOCRIT: 42 % (ref 39.0–52.0)
HEMOGLOBIN: 10.2 g/dL — AB (ref 13.0–17.0)
HEMOGLOBIN: 11.2 g/dL — AB (ref 13.0–17.0)
HEMOGLOBIN: 13.3 g/dL (ref 13.0–17.0)
HEMOGLOBIN: 9.5 g/dL — AB (ref 13.0–17.0)
Hemoglobin: 14.3 g/dL (ref 13.0–17.0)
Hemoglobin: 10.9 g/dL — ABNORMAL LOW (ref 13.0–17.0)
Hemoglobin: 10.9 g/dL — ABNORMAL LOW (ref 13.0–17.0)
POTASSIUM: 4.6 mmol/L (ref 3.5–5.1)
POTASSIUM: 4.9 mmol/L (ref 3.5–5.1)
POTASSIUM: 5.1 mmol/L (ref 3.5–5.1)
POTASSIUM: 6.7 mmol/L — AB (ref 3.5–5.1)
Potassium: 4.4 mmol/L (ref 3.5–5.1)
Potassium: 5.2 mmol/L — ABNORMAL HIGH (ref 3.5–5.1)
Potassium: 4.1 mmol/L (ref 3.5–5.1)
SODIUM: 138 mmol/L (ref 135–145)
SODIUM: 140 mmol/L (ref 135–145)
Sodium: 139 mmol/L (ref 135–145)
Sodium: 137 mmol/L (ref 135–145)
Sodium: 138 mmol/L (ref 135–145)
Sodium: 141 mmol/L (ref 135–145)
Sodium: 144 mmol/L (ref 135–145)
TCO2: 20 mmol/L — ABNORMAL LOW (ref 22–32)
TCO2: 22 mmol/L (ref 22–32)
TCO2: 22 mmol/L (ref 22–32)
TCO2: 22 mmol/L (ref 22–32)
TCO2: 23 mmol/L (ref 22–32)
TCO2: 24 mmol/L (ref 22–32)
TCO2: 24 mmol/L (ref 22–32)

## 2018-06-20 LAB — POCT I-STAT 3, ART BLOOD GAS (G3+)
ACID-BASE DEFICIT: 6 mmol/L — AB (ref 0.0–2.0)
ACID-BASE DEFICIT: 5 mmol/L — AB (ref 0.0–2.0)
ACID-BASE DEFICIT: 4 mmol/L — AB (ref 0.0–2.0)
ACID-BASE DEFICIT: 1 mmol/L (ref 0.0–2.0)
Acid-Base Excess: 1 mmol/L (ref 0.0–2.0)
Acid-Base Excess: 1 mmol/L (ref 0.0–2.0)
Acid-Base Excess: 2 mmol/L (ref 0.0–2.0)
Acid-Base Excess: 2 mmol/L (ref 0.0–2.0)
Acid-base deficit: 2 mmol/L (ref 0.0–2.0)
Acid-base deficit: 8 mmol/L — ABNORMAL HIGH (ref 0.0–2.0)
Acid-base deficit: 1 mmol/L (ref 0.0–2.0)
BICARBONATE: 22.2 mmol/L (ref 20.0–28.0)
Bicarbonate: 22.9 mmol/L (ref 20.0–28.0)
Bicarbonate: 23.1 mmol/L (ref 20.0–28.0)
Bicarbonate: 23.4 mmol/L (ref 20.0–28.0)
Bicarbonate: 24.8 mmol/L (ref 20.0–28.0)
Bicarbonate: 25 mmol/L (ref 20.0–28.0)
Bicarbonate: 26.4 mmol/L (ref 20.0–28.0)
Bicarbonate: 25.6 mmol/L (ref 20.0–28.0)
Bicarbonate: 24.7 mmol/L (ref 20.0–28.0)
Bicarbonate: 24.8 mmol/L (ref 20.0–28.0)
Bicarbonate: 25.4 mmol/L (ref 20.0–28.0)
Bicarbonate: 25.8 mmol/L (ref 20.0–28.0)
O2 SAT: 94 %
O2 SAT: 97 %
O2 Saturation: 100 %
O2 Saturation: 100 %
O2 Saturation: 98 %
O2 Saturation: 99 %
O2 Saturation: 88 %
O2 Saturation: 98 %
O2 Saturation: 93 %
O2 Saturation: 90 %
O2 Saturation: 91 %
O2 Saturation: 92 %
PCO2 ART: 54.2 mmHg — AB (ref 32.0–48.0)
PCO2 ART: 60.5 mmHg — AB (ref 32.0–48.0)
PCO2 ART: 72.9 mmHg — AB (ref 32.0–48.0)
PH ART: 7.091 — AB (ref 7.350–7.450)
PH ART: 7.217 — AB (ref 7.350–7.450)
PH ART: 7.317 — AB (ref 7.350–7.450)
PO2 ART: 451 mmHg — AB (ref 83.0–108.0)
Patient temperature: 34.7
Patient temperature: 35.7
Patient temperature: 37.9
Patient temperature: 38
Patient temperature: 37.9
Patient temperature: 37.8
TCO2: 24 mmol/L (ref 22–32)
TCO2: 25 mmol/L (ref 22–32)
TCO2: 25 mmol/L (ref 22–32)
TCO2: 25 mmol/L (ref 22–32)
TCO2: 26 mmol/L (ref 22–32)
TCO2: 27 mmol/L (ref 22–32)
TCO2: 28 mmol/L (ref 22–32)
TCO2: 27 mmol/L (ref 22–32)
TCO2: 26 mmol/L (ref 22–32)
TCO2: 26 mmol/L (ref 22–32)
TCO2: 26 mmol/L (ref 22–32)
TCO2: 27 mmol/L (ref 22–32)
pCO2 arterial: 57.6 mmHg — ABNORMAL HIGH (ref 32.0–48.0)
pCO2 arterial: 53.1 mmHg — ABNORMAL HIGH (ref 32.0–48.0)
pCO2 arterial: 48.3 mmHg — ABNORMAL HIGH (ref 32.0–48.0)
pCO2 arterial: 51.4 mmHg — ABNORMAL HIGH (ref 32.0–48.0)
pCO2 arterial: 41.2 mmHg (ref 32.0–48.0)
pCO2 arterial: 38.4 mmHg (ref 32.0–48.0)
pCO2 arterial: 36.6 mmHg (ref 32.0–48.0)
pCO2 arterial: 37.5 mmHg (ref 32.0–48.0)
pCO2 arterial: 38.4 mmHg (ref 32.0–48.0)
pH, Arterial: 7.186 — CL (ref 7.350–7.450)
pH, Arterial: 7.247 — ABNORMAL LOW (ref 7.350–7.450)
pH, Arterial: 7.272 — ABNORMAL LOW (ref 7.350–7.450)
pH, Arterial: 7.307 — ABNORMAL LOW (ref 7.350–7.450)
pH, Arterial: 7.395 (ref 7.350–7.450)
pH, Arterial: 7.42 (ref 7.350–7.450)
pH, Arterial: 7.438 (ref 7.350–7.450)
pH, Arterial: 7.443 (ref 7.350–7.450)
pH, Arterial: 7.443 (ref 7.350–7.450)
pO2, Arterial: 112 mmHg — ABNORMAL HIGH (ref 83.0–108.0)
pO2, Arterial: 120 mmHg — ABNORMAL HIGH (ref 83.0–108.0)
pO2, Arterial: 192 mmHg — ABNORMAL HIGH (ref 83.0–108.0)
pO2, Arterial: 525 mmHg — ABNORMAL HIGH (ref 83.0–108.0)
pO2, Arterial: 88 mmHg (ref 83.0–108.0)
pO2, Arterial: 55 mmHg — ABNORMAL LOW (ref 83.0–108.0)
pO2, Arterial: 109 mmHg — ABNORMAL HIGH (ref 83.0–108.0)
pO2, Arterial: 67 mmHg — ABNORMAL LOW (ref 83.0–108.0)
pO2, Arterial: 59 mmHg — ABNORMAL LOW (ref 83.0–108.0)
pO2, Arterial: 61 mmHg — ABNORMAL LOW (ref 83.0–108.0)
pO2, Arterial: 63 mmHg — ABNORMAL LOW (ref 83.0–108.0)

## 2018-06-20 LAB — APTT: aPTT: 34 seconds (ref 24–36)

## 2018-06-20 LAB — POCT I-STAT 4, (NA,K, GLUC, HGB,HCT)
Glucose, Bld: 246 mg/dL — ABNORMAL HIGH (ref 70–99)
Glucose, Bld: 198 mg/dL — ABNORMAL HIGH (ref 70–99)
HCT: 28 % — ABNORMAL LOW (ref 39.0–52.0)
HCT: 33 % — ABNORMAL LOW (ref 39.0–52.0)
Hemoglobin: 9.5 g/dL — ABNORMAL LOW (ref 13.0–17.0)
Hemoglobin: 11.2 g/dL — ABNORMAL LOW (ref 13.0–17.0)
Potassium: 3.7 mmol/L (ref 3.5–5.1)
Potassium: 3.9 mmol/L (ref 3.5–5.1)
Sodium: 143 mmol/L (ref 135–145)
Sodium: 146 mmol/L — ABNORMAL HIGH (ref 135–145)

## 2018-06-20 LAB — CREATININE, SERUM
Creatinine, Ser: 1.12 mg/dL (ref 0.61–1.24)
GFR calc Af Amer: >60 mL/min (ref >60)
GFR calc non Af Amer: >60 mL/min (ref >60)

## 2018-06-20 LAB — POCT I-STAT 7, (LYTES, BLD GAS, ICA,H+H)
Acid-base deficit: 1 mmol/L (ref 0.0–2.0)
Acid-base deficit: 7 mmol/L — ABNORMAL HIGH (ref 0.0–2.0)
Acid-base deficit: 1 mmol/L (ref 0.0–2.0)
Bicarbonate: 21 mmol/L (ref 20.0–28.0)
Bicarbonate: 28.4 mmol/L — ABNORMAL HIGH (ref 20.0–28.0)
Bicarbonate: 26.3 mmol/L (ref 20.0–28.0)
Calcium, Ion: 0.96 mmol/L — ABNORMAL LOW (ref 1.15–1.40)
Calcium, Ion: 1.34 mmol/L (ref 1.15–1.40)
Calcium, Ion: 1.03 mmol/L — ABNORMAL LOW (ref 1.15–1.40)
HCT: 29 % — ABNORMAL LOW (ref 39.0–52.0)
HCT: 35 % — ABNORMAL LOW (ref 39.0–52.0)
HCT: 32 % — ABNORMAL LOW (ref 39.0–52.0)
Hemoglobin: 11.9 g/dL — ABNORMAL LOW (ref 13.0–17.0)
Hemoglobin: 9.9 g/dL — ABNORMAL LOW (ref 13.0–17.0)
Hemoglobin: 10.9 g/dL — ABNORMAL LOW (ref 13.0–17.0)
O2 Saturation: 60 %
O2 Saturation: 90 %
O2 Saturation: 98 %
Potassium: 3.7 mmol/L (ref 3.5–5.1)
Potassium: 3.7 mmol/L (ref 3.5–5.1)
Potassium: 3.9 mmol/L (ref 3.5–5.1)
Sodium: 143 mmol/L (ref 135–145)
Sodium: 146 mmol/L — ABNORMAL HIGH (ref 135–145)
Sodium: 145 mmol/L (ref 135–145)
TCO2: 23 mmol/L (ref 22–32)
TCO2: 30 mmol/L (ref 22–32)
TCO2: 28 mmol/L (ref 22–32)
pCO2 arterial: 54.3 mmHg — ABNORMAL HIGH (ref 32.0–48.0)
pCO2 arterial: 69.1 mmHg (ref 32.0–48.0)
pCO2 arterial: 57.2 mmHg — ABNORMAL HIGH (ref 32.0–48.0)
pH, Arterial: 7.196 — CL (ref 7.350–7.450)
pH, Arterial: 7.221 — ABNORMAL LOW (ref 7.350–7.450)
pH, Arterial: 7.271 — ABNORMAL LOW (ref 7.350–7.450)
pO2, Arterial: 39 mmHg — CL (ref 83.0–108.0)
pO2, Arterial: 73 mmHg — ABNORMAL LOW (ref 83.0–108.0)
pO2, Arterial: 117 mmHg — ABNORMAL HIGH (ref 83.0–108.0)

## 2018-06-20 LAB — BASIC METABOLIC PANEL
ANION GAP: 8 (ref 5–15)
BUN: 9 mg/dL (ref 6–20)
CHLORIDE: 107 mmol/L (ref 98–111)
CO2: 27 mmol/L (ref 22–32)
Calcium: 8.2 mg/dL — ABNORMAL LOW (ref 8.9–10.3)
Creatinine, Ser: 1.18 mg/dL (ref 0.61–1.24)
GFR calc Af Amer: >60 mL/min (ref >60)
GFR calc non Af Amer: >60 mL/min (ref >60)
Glucose, Bld: 207 mg/dL — ABNORMAL HIGH (ref 70–99)
POTASSIUM: 3.7 mmol/L (ref 3.5–5.1)
SODIUM: 142 mmol/L (ref 135–145)

## 2018-06-20 LAB — POCT I-STAT EG7
Acid-base deficit: 3 mmol/L — ABNORMAL HIGH (ref 0.0–2.0)
Bicarbonate: 24.7 mmol/L (ref 20.0–28.0)
Calcium, Ion: 1.11 mmol/L — ABNORMAL LOW (ref 1.15–1.40)
HCT: 32 % — ABNORMAL LOW (ref 39.0–52.0)
Hemoglobin: 10.9 g/dL — ABNORMAL LOW (ref 13.0–17.0)
O2 Saturation: 58 %
Potassium: 3.4 mmol/L — ABNORMAL LOW (ref 3.5–5.1)
Sodium: 147 mmol/L — ABNORMAL HIGH (ref 135–145)
TCO2: 26 mmol/L (ref 22–32)
pCO2, Ven: 58.5 mmHg (ref 44.0–60.0)
pH, Ven: 7.233 — ABNORMAL LOW (ref 7.250–7.430)
pO2, Ven: 36 mmHg (ref 32.0–45.0)

## 2018-06-20 LAB — GLUCOSE, CAPILLARY
Glucose-Capillary: 168 mg/dL — ABNORMAL HIGH (ref 70–99)
Glucose-Capillary: 161 mg/dL — ABNORMAL HIGH (ref 70–99)
Glucose-Capillary: 141 mg/dL — ABNORMAL HIGH (ref 70–99)
Glucose-Capillary: 137 mg/dL — ABNORMAL HIGH (ref 70–99)
Glucose-Capillary: 101 mg/dL — ABNORMAL HIGH (ref 70–99)
Glucose-Capillary: 103 mg/dL — ABNORMAL HIGH (ref 70–99)
Glucose-Capillary: 87 mg/dL (ref 70–99)
Glucose-Capillary: 112 mg/dL — ABNORMAL HIGH (ref 70–99)
Glucose-Capillary: 66 mg/dL — ABNORMAL LOW (ref 70–99)
Glucose-Capillary: 86 mg/dL (ref 70–99)
Glucose-Capillary: 108 mg/dL — ABNORMAL HIGH (ref 70–99)

## 2018-06-20 LAB — HEMOGLOBIN AND HEMATOCRIT, BLOOD
HCT: 29.8 % — ABNORMAL LOW (ref 39.0–52.0)
Hemoglobin: 9.7 g/dL — ABNORMAL LOW (ref 13.0–17.0)

## 2018-06-20 LAB — PROTIME-INR
INR: 0.95
Prothrombin Time: 12.5 seconds (ref 11.4–15.2)

## 2018-06-20 LAB — PREPARE RBC (CROSSMATCH)

## 2018-06-20 LAB — PLATELET COUNT: Platelets: 85 10*3/uL — ABNORMAL LOW (ref 150–400)

## 2018-06-20 LAB — MAGNESIUM
MAGNESIUM: 1.8 mg/dL (ref 1.7–2.4)
Magnesium: 2.4 mg/dL (ref 1.7–2.4)

## 2018-06-20 LAB — FIBRINOGEN: Fibrinogen: 121 mg/dL — ABNORMAL LOW (ref 210–475)

## 2018-06-20 LAB — MRSA PCR SCREENING: MRSA by PCR: NEGATIVE

## 2018-06-20 MED ORDER — SODIUM CHLORIDE 0.9% FLUSH
10.0000 mL | Freq: Two times a day (BID) | INTRAVENOUS | Status: DC
  Administered 2018-06-20 – 2018-06-22 (×3): 10 mL
  Administered 2018-06-24: 30 mL

## 2018-06-20 MED ORDER — MORPHINE SULFATE (PF) 2 MG/ML IV SOLN: 1.0000 mg | INTRAVENOUS | Status: AC | PRN

## 2018-06-20 MED ORDER — SODIUM CHLORIDE 0.9 % IV SOLN
250.0000 mL | INTRAVENOUS | Status: DC
  Administered 2018-06-20: 07:00:00 via INTRAVENOUS

## 2018-06-20 MED ORDER — EPINEPHRINE PF 1 MG/ML IJ SOLN: 0.5000 ug/min | INTRAVENOUS | Status: DC

## 2018-06-20 MED ORDER — NITROGLYCERIN IN D5W 200-5 MCG/ML-% IV SOLN
INTRAVENOUS | Status: DC | PRN
  Administered 2018-06-20: 5 ug/min via INTRAVENOUS

## 2018-06-20 MED ORDER — PHENYLEPHRINE HCL 10 MG/ML IJ SOLN
INTRAMUSCULAR | Status: AC
  Filled 2018-06-20: qty 2

## 2018-06-20 MED ORDER — MILRINONE LACTATE IN DEXTROSE 20-5 MG/100ML-% IV SOLN: 0.2500 ug/kg/min | INTRAVENOUS | Status: DC

## 2018-06-20 MED ORDER — PLASMA-LYTE 148 IV SOLN
INTRAVENOUS | Status: DC | PRN
  Administered 2018-06-20: 500 mL via INTRAVASCULAR

## 2018-06-20 MED ORDER — PROTAMINE SULFATE 10 MG/ML IV SOLN
INTRAVENOUS | Status: DC | PRN
  Administered 2018-06-20: 330 mg via INTRAVENOUS

## 2018-06-20 MED ORDER — SODIUM CHLORIDE 0.9% FLUSH: 10.0000 mL | INTRAVENOUS | Status: DC | PRN

## 2018-06-20 MED ORDER — CHLORHEXIDINE GLUCONATE 0.12% ORAL RINSE (MEDLINE KIT)
15.0000 mL | Freq: Two times a day (BID) | OROMUCOSAL | Status: DC
  Administered 2018-06-22 – 2018-06-28 (×10): 15 mL via OROMUCOSAL

## 2018-06-20 MED ORDER — SUCCINYLCHOLINE CHLORIDE 200 MG/10ML IV SOSY
PREFILLED_SYRINGE | INTRAVENOUS | Status: AC
  Filled 2018-06-20: qty 10

## 2018-06-20 MED ORDER — PHENYLEPHRINE HCL-NACL 20-0.9 MG/250ML-% IV SOLN
0.0000 ug/min | INTRAVENOUS | Status: DC
  Filled 2018-06-20: qty 250

## 2018-06-20 MED ORDER — LABETALOL HCL 5 MG/ML IV SOLN
10.0000 mg | INTRAVENOUS | Status: DC | PRN
  Administered 2018-06-20 – 2018-06-23 (×5): 10 mg via INTRAVENOUS
  Filled 2018-06-20 (×5): qty 4

## 2018-06-20 MED ORDER — EPINEPHRINE PF 1 MG/ML IJ SOLN
INTRAMUSCULAR | Status: DC | PRN
  Administered 2018-06-20: 1 mg via INTRAVENOUS

## 2018-06-20 MED ORDER — DOCUSATE SODIUM 100 MG PO CAPS
200.0000 mg | ORAL_CAPSULE | Freq: Every day | ORAL | Status: DC
  Administered 2018-06-21 – 2018-06-25 (×5): 200 mg via ORAL
  Filled 2018-06-20 (×5): qty 2

## 2018-06-20 MED ORDER — BISACODYL 10 MG RE SUPP: 10.0000 mg | Freq: Every day | RECTAL | Status: DC

## 2018-06-20 MED ORDER — LABETALOL HCL 5 MG/ML IV SOLN
10.0000 mg | INTRAVENOUS | Status: DC
  Filled 2018-06-20: qty 4

## 2018-06-20 MED ORDER — TRAMADOL HCL 50 MG PO TABS
50.0000 mg | ORAL_TABLET | ORAL | Status: DC | PRN
  Administered 2018-06-21 – 2018-06-25 (×3): 100 mg via ORAL
  Administered 2018-06-26: 50 mg via ORAL
  Filled 2018-06-20: qty 2
  Filled 2018-06-20: qty 1
  Filled 2018-06-20 (×2): qty 2

## 2018-06-20 MED ORDER — OXYCODONE HCL 5 MG PO TABS
5.0000 mg | ORAL_TABLET | ORAL | Status: DC | PRN
  Administered 2018-06-21 – 2018-06-22 (×9): 10 mg via ORAL
  Administered 2018-06-22 (×2): 5 mg via ORAL
  Administered 2018-06-22: 10 mg via ORAL
  Administered 2018-06-23: 5 mg via ORAL
  Administered 2018-06-23: 10 mg via ORAL
  Administered 2018-06-23: 5 mg via ORAL
  Administered 2018-06-23: 10 mg via ORAL
  Administered 2018-06-23 – 2018-06-24 (×2): 5 mg via ORAL
  Administered 2018-06-24 (×2): 10 mg via ORAL
  Administered 2018-06-24 (×2): 5 mg via ORAL
  Administered 2018-06-24: 10 mg via ORAL
  Administered 2018-06-25: 5 mg via ORAL
  Administered 2018-06-25 (×2): 10 mg via ORAL
  Administered 2018-06-25 – 2018-06-26 (×2): 5 mg via ORAL
  Filled 2018-06-20 (×2): qty 2
  Filled 2018-06-20: qty 1
  Filled 2018-06-20: qty 2
  Filled 2018-06-20 (×4): qty 1
  Filled 2018-06-20: qty 2
  Filled 2018-06-20 (×2): qty 1
  Filled 2018-06-20: qty 2
  Filled 2018-06-20: qty 1
  Filled 2018-06-20 (×2): qty 2
  Filled 2018-06-20 (×2): qty 1
  Filled 2018-06-20 (×3): qty 2
  Filled 2018-06-20: qty 1
  Filled 2018-06-20 (×7): qty 2

## 2018-06-20 MED ORDER — METOPROLOL TARTRATE 12.5 MG HALF TABLET
12.5000 mg | ORAL_TABLET | Freq: Two times a day (BID) | ORAL | Status: DC
  Administered 2018-06-20: 12.5 mg via ORAL
  Filled 2018-06-20: qty 1

## 2018-06-20 MED ORDER — METOCLOPRAMIDE HCL 5 MG/ML IJ SOLN
10.0000 mg | Freq: Four times a day (QID) | INTRAMUSCULAR | Status: AC
  Administered 2018-06-20 – 2018-06-21 (×4): 10 mg via INTRAVENOUS
  Filled 2018-06-20 (×3): qty 2

## 2018-06-20 MED ORDER — DOPAMINE-DEXTROSE 3.2-5 MG/ML-% IV SOLN
INTRAVENOUS | Status: DC | PRN
  Administered 2018-06-20: 3 ug/kg/min via INTRAVENOUS

## 2018-06-20 MED ORDER — PANTOPRAZOLE SODIUM 40 MG PO TBEC
40.0000 mg | DELAYED_RELEASE_TABLET | Freq: Every day | ORAL | Status: DC
  Administered 2018-06-22 – 2018-06-25 (×4): 40 mg via ORAL
  Filled 2018-06-20 (×4): qty 1

## 2018-06-20 MED ORDER — MILRINONE LACTATE IN DEXTROSE 20-5 MG/100ML-% IV SOLN
0.2500 ug/kg/min | INTRAVENOUS | Status: AC
  Administered 2018-06-20 – 2018-06-21 (×2): 0.25 ug/kg/min via INTRAVENOUS
  Filled 2018-06-20 (×2): qty 100

## 2018-06-20 MED ORDER — COAGULATION FACTOR VIIA RECOMB 1 MG IV SOLR
90.0000 ug/kg | Freq: Once | INTRAVENOUS | Status: AC
  Administered 2018-06-20: 8 mg via INTRAVENOUS
  Filled 2018-06-20: qty 5

## 2018-06-20 MED ORDER — SODIUM CHLORIDE 0.9 % IV SOLN
1.5000 g | Freq: Two times a day (BID) | INTRAVENOUS | Status: AC
  Administered 2018-06-20 – 2018-06-21 (×4): 1.5 g via INTRAVENOUS
  Filled 2018-06-20 (×4): qty 1.5

## 2018-06-20 MED ORDER — NOREPINEPHRINE 4 MG/250ML-% IV SOLN
0.0000 ug/min | INTRAVENOUS | Status: DC
  Filled 2018-06-20: qty 250

## 2018-06-20 MED ORDER — LIDOCAINE 2% (20 MG/ML) 5 ML SYRINGE
INTRAMUSCULAR | Status: AC
  Filled 2018-06-20: qty 5

## 2018-06-20 MED ORDER — DEXMEDETOMIDINE HCL IN NACL 400 MCG/100ML IV SOLN
INTRAVENOUS | Status: DC | PRN
  Administered 2018-06-20: .5 ug/kg/h via INTRAVENOUS

## 2018-06-20 MED ORDER — MILRINONE LACTATE IN DEXTROSE 20-5 MG/100ML-% IV SOLN
INTRAVENOUS | Status: DC | PRN
  Administered 2018-06-20: .25 ug/kg/min via INTRAVENOUS

## 2018-06-20 MED ORDER — MORPHINE SULFATE (PF) 2 MG/ML IV SOLN
2.0000 mg | INTRAVENOUS | Status: DC | PRN
  Administered 2018-06-20 – 2018-06-21 (×8): 4 mg via INTRAVENOUS
  Administered 2018-06-22: 2 mg via INTRAVENOUS
  Administered 2018-06-22: 4 mg via INTRAVENOUS
  Administered 2018-06-25: 2 mg via INTRAVENOUS
  Filled 2018-06-20 (×6): qty 2
  Filled 2018-06-20 (×2): qty 1
  Filled 2018-06-20 (×4): qty 2

## 2018-06-20 MED ORDER — DEXMEDETOMIDINE HCL IN NACL 200 MCG/50ML IV SOLN
0.0000 ug/kg/h | INTRAVENOUS | Status: DC
  Administered 2018-06-20: 0.7 ug/kg/h via INTRAVENOUS
  Filled 2018-06-20: qty 50

## 2018-06-20 MED ORDER — MAGNESIUM SULFATE 4 GM/100ML IV SOLN
4.0000 g | Freq: Once | INTRAVENOUS | Status: AC
  Administered 2018-06-20: 4 g via INTRAVENOUS
  Filled 2018-06-20: qty 100

## 2018-06-20 MED ORDER — SODIUM CHLORIDE 0.9 % IV SOLN
INTRAVENOUS | Status: DC
  Administered 2018-06-20: 7.4 [IU]/h via INTRAVENOUS
  Filled 2018-06-20: qty 1

## 2018-06-20 MED ORDER — PROPOFOL 10 MG/ML IV BOLUS
INTRAVENOUS | Status: AC
  Filled 2018-06-20: qty 20

## 2018-06-20 MED ORDER — CHLORHEXIDINE GLUCONATE 0.12 % MT SOLN
15.0000 mL | OROMUCOSAL | Status: AC
  Administered 2018-06-20: 15 mL via OROMUCOSAL

## 2018-06-20 MED ORDER — INSULIN REGULAR BOLUS VIA INFUSION
0.0000 [IU] | Freq: Three times a day (TID) | INTRAVENOUS | Status: DC
  Filled 2018-06-20: qty 10

## 2018-06-20 MED ORDER — POTASSIUM CHLORIDE 10 MEQ/50ML IV SOLN
10.0000 meq | INTRAVENOUS | Status: AC
  Administered 2018-06-20 (×3): 10 meq via INTRAVENOUS

## 2018-06-20 MED ORDER — ROCURONIUM BROMIDE 50 MG/5ML IV SOSY
PREFILLED_SYRINGE | INTRAVENOUS | Status: AC
  Filled 2018-06-20: qty 10

## 2018-06-20 MED ORDER — ASPIRIN EC 325 MG PO TBEC
325.0000 mg | DELAYED_RELEASE_TABLET | Freq: Every day | ORAL | Status: DC
  Administered 2018-06-21: 325 mg via ORAL
  Filled 2018-06-20: qty 1

## 2018-06-20 MED ORDER — DIPHENHYDRAMINE HCL 50 MG/ML IJ SOLN
INTRAMUSCULAR | Status: DC | PRN
  Administered 2018-06-20: 50 mg via INTRAVENOUS

## 2018-06-20 MED ORDER — FAMOTIDINE IN NACL 20-0.9 MG/50ML-% IV SOLN
20.0000 mg | Freq: Two times a day (BID) | INTRAVENOUS | Status: AC
  Administered 2018-06-20: 20 mg via INTRAVENOUS

## 2018-06-20 MED ORDER — ACETAMINOPHEN 650 MG RE SUPP
650.0000 mg | Freq: Once | RECTAL | Status: AC
  Administered 2018-06-20: 650 mg via RECTAL

## 2018-06-20 MED ORDER — SODIUM CHLORIDE 0.9% FLUSH
3.0000 mL | Freq: Two times a day (BID) | INTRAVENOUS | Status: DC
  Administered 2018-06-21 – 2018-06-25 (×7): 3 mL via INTRAVENOUS

## 2018-06-20 MED ORDER — METOPROLOL TARTRATE 5 MG/5ML IV SOLN
2.5000 mg | INTRAVENOUS | Status: DC | PRN
  Administered 2018-06-20 – 2018-06-21 (×3): 5 mg via INTRAVENOUS
  Filled 2018-06-20 (×3): qty 5

## 2018-06-20 MED ORDER — NITROGLYCERIN IN D5W 200-5 MCG/ML-% IV SOLN
0.0000 ug/min | INTRAVENOUS | Status: AC
  Administered 2018-06-21: 60 ug/min via INTRAVENOUS
  Administered 2018-06-21: 55 ug/min via INTRAVENOUS
  Filled 2018-06-20 (×2): qty 250

## 2018-06-20 MED ORDER — INSULIN ASPART 100 UNIT/ML ~~LOC~~ SOLN
0.0000 [IU] | SUBCUTANEOUS | Status: DC
  Administered 2018-06-20: 2 [IU] via SUBCUTANEOUS

## 2018-06-20 MED ORDER — SODIUM CHLORIDE 0.9% FLUSH: 3.0000 mL | INTRAVENOUS | Status: DC | PRN

## 2018-06-20 MED ORDER — SODIUM CHLORIDE 0.45 % IV SOLN: INTRAVENOUS | Status: DC | PRN

## 2018-06-20 MED ORDER — LACTATED RINGERS IV SOLN
INTRAVENOUS | Status: DC
  Administered 2018-06-20 (×2): via INTRAVENOUS

## 2018-06-20 MED ORDER — SODIUM CHLORIDE 0.9 % IV SOLN
INTRAVENOUS | Status: DC
  Administered 2018-06-20 (×2): via INTRAVENOUS

## 2018-06-20 MED ORDER — BISACODYL 5 MG PO TBEC
10.0000 mg | DELAYED_RELEASE_TABLET | Freq: Every day | ORAL | Status: DC
  Administered 2018-06-21 – 2018-06-25 (×5): 10 mg via ORAL
  Filled 2018-06-20 (×5): qty 2

## 2018-06-20 MED ORDER — ACETAMINOPHEN 160 MG/5ML PO SOLN
650.0000 mg | Freq: Four times a day (QID) | ORAL | Status: DC | PRN
  Administered 2018-06-20: 650 mg via ORAL

## 2018-06-20 MED ORDER — MIDAZOLAM HCL 2 MG/2ML IJ SOLN
2.0000 mg | INTRAMUSCULAR | Status: DC | PRN
  Filled 2018-06-20 (×2): qty 2

## 2018-06-20 MED ORDER — NOREPINEPHRINE BITARTRATE 1 MG/ML IV SOLN
INTRAVENOUS | Status: DC | PRN
  Administered 2018-06-20: 4 ug/min via INTRAVENOUS

## 2018-06-20 MED ORDER — HEMOSTATIC AGENTS (NO CHARGE) OPTIME
TOPICAL | Status: DC | PRN
  Administered 2018-06-20: 1 via TOPICAL

## 2018-06-20 MED ORDER — DOPAMINE-DEXTROSE 3.2-5 MG/ML-% IV SOLN
2.0000 ug/kg/min | INTRAVENOUS | Status: DC
  Administered 2018-06-20 – 2018-06-22 (×2): 2 ug/kg/min via INTRAVENOUS
  Filled 2018-06-20: qty 250

## 2018-06-20 MED ORDER — ONDANSETRON HCL 4 MG/2ML IJ SOLN: 4.0000 mg | Freq: Four times a day (QID) | INTRAMUSCULAR | Status: DC | PRN

## 2018-06-20 MED ORDER — EPINEPHRINE PF 1 MG/ML IJ SOLN
INTRAVENOUS | Status: DC | PRN
  Administered 2018-06-20: 2 ug/min via INTRAVENOUS

## 2018-06-20 MED ORDER — METOPROLOL TARTRATE 25 MG/10 ML ORAL SUSPENSION: 12.5000 mg | Freq: Two times a day (BID) | ORAL | Status: DC

## 2018-06-20 MED ORDER — HYDROCORTISONE NA SUCCINATE PF 100 MG IJ SOLR
INTRAMUSCULAR | Status: DC | PRN
  Administered 2018-06-20: 125 mg via INTRAVENOUS

## 2018-06-20 MED ORDER — SODIUM BICARBONATE 8.4 % IV SOLN
INTRAVENOUS | Status: DC | PRN
  Administered 2018-06-20 (×2): 50 meq via INTRAVENOUS

## 2018-06-20 MED ORDER — ALBUMIN HUMAN 5 % IV SOLN
250.0000 mL | INTRAVENOUS | Status: AC | PRN
  Administered 2018-06-20: 12.5 g via INTRAVENOUS

## 2018-06-20 MED ORDER — DEXTROSE 50 % IV SOLN
INTRAVENOUS | Status: AC
  Administered 2018-06-20: 14 mL
  Filled 2018-06-20: qty 50

## 2018-06-20 MED ORDER — CALCIUM CHLORIDE 10 % IV SOLN
INTRAVENOUS | Status: DC | PRN
  Administered 2018-06-20 (×3): 1 g via INTRAVENOUS

## 2018-06-20 MED ORDER — VANCOMYCIN HCL IN DEXTROSE 1-5 GM/200ML-% IV SOLN
1000.0000 mg | Freq: Once | INTRAVENOUS | Status: AC
  Administered 2018-06-20: 1000 mg via INTRAVENOUS
  Filled 2018-06-20: qty 200

## 2018-06-20 MED ORDER — CALCIUM CHLORIDE 10 % IV SOLN
INTRAVENOUS | Status: AC
  Filled 2018-06-20: qty 20

## 2018-06-20 MED ORDER — LACTATED RINGERS IV SOLN: 500.0000 mL | Freq: Once | INTRAVENOUS | Status: DC | PRN

## 2018-06-20 MED ORDER — ASPIRIN 81 MG PO CHEW: 324.0000 mg | CHEWABLE_TABLET | Freq: Every day | ORAL | Status: DC

## 2018-06-20 MED ORDER — LACTATED RINGERS IV SOLN: INTRAVENOUS | Status: DC

## 2018-06-20 MED ORDER — TRANEXAMIC ACID 1000 MG/10ML IV SOLN
1.5000 mg/kg/h | INTRAVENOUS | Status: DC
  Filled 2018-06-20: qty 25

## 2018-06-20 MED ORDER — SODIUM CHLORIDE 0.9% IV SOLUTION: Freq: Once | INTRAVENOUS | Status: AC

## 2018-06-20 MED ORDER — ACETAMINOPHEN 500 MG PO TABS
1000.0000 mg | ORAL_TABLET | Freq: Four times a day (QID) | ORAL | Status: DC
  Administered 2018-06-21 – 2018-06-23 (×9): 1000 mg via ORAL
  Administered 2018-06-23: 500 mg via ORAL
  Administered 2018-06-23 – 2018-06-24 (×4): 1000 mg via ORAL
  Filled 2018-06-20 (×14): qty 2

## 2018-06-20 MED ORDER — MIDAZOLAM HCL 2 MG/2ML IJ SOLN
INTRAMUSCULAR | Status: AC
  Filled 2018-06-20: qty 2

## 2018-06-20 MED ORDER — ACETAMINOPHEN 160 MG/5ML PO SOLN: 1000.0000 mg | Freq: Four times a day (QID) | ORAL | Status: DC

## 2018-06-20 MED ORDER — ORAL CARE MOUTH RINSE
15.0000 mL | OROMUCOSAL | Status: DC
  Administered 2018-06-20 (×2): 15 mL via OROMUCOSAL

## 2018-06-20 MED ORDER — ACETAMINOPHEN 160 MG/5ML PO SOLN: 650.0000 mg | Freq: Once | ORAL | Status: AC

## 2018-06-20 MED ORDER — CHLORHEXIDINE GLUCONATE CLOTH 2 % EX PADS
6.0000 | MEDICATED_PAD | Freq: Every day | CUTANEOUS | Status: DC
  Administered 2018-06-20 – 2018-06-22 (×3): 6 via TOPICAL

## 2018-06-20 NOTE — Transfer of Care (Signed)
Immediate Anesthesia Transfer of Care Note  Patient: Timothy West  Procedure(s) Performed: REPAIR OF ACUTE TYPE I ASCENDING AORTIC DISSECTION. REPLACEMENT OF AORTA (HEMI ARCH) WITH  STRAIGHT HEMASHIELD PLATINUM GRAFT. RESUSPENSION OF AORTIC VALVE. HYPOTHERMIC CIRCULATORY ARREST WITH ANTEGRADE CEREBRAL PERFUSION. (N/A Chest) INTRAOPERATIVE TRANSESOPHAGEAL ECHOCARDIOGRAM (Esophagus) CORONARY ARTERY BYPASS GRAFTING (CABG) x1 WITH ENDOSCOPIC VEIN HARVEST (RIGHT THIGH). SVG TO THE RIGHT CORONARY ARTERY. (N/A Chest)  Patient Location: SICU  Anesthesia Type:General  Level of Consciousness: Patient remains intubated per anesthesia plan  Airway & Oxygen Therapy: Patient remains intubated per anesthesia plan and Patient placed on Ventilator (see vital sign flow sheet for setting)  Post-op Assessment: Post -op Vital signs reviewed and stable  Post vital signs: stable  Last Vitals:  Vitals Value Taken Time  BP    Temp 34.8 C 06/20/2018  8:23 AM  Pulse 102 06/20/2018  8:23 AM  Resp 18 06/20/2018  8:23 AM  SpO2 94 % 06/20/2018  8:23 AM  Vitals shown include unvalidated device data.  Last Pain:  Vitals:   06/19/18 2132  TempSrc:   PainSc: 0-No pain         Complications: No apparent anesthesia complications

## 2018-06-20 NOTE — Progress Notes (Signed)
TCTS BRIEF SICU PROGRESS NOTE  1 Day Post-Op  S/P Procedure(s) (LRB): REPAIR OF ACUTE TYPE I ASCENDING AORTIC DISSECTION. REPLACEMENT OF AORTA (HEMI ARCH) WITH  STRAIGHT HEMASHIELD PLATINUM GRAFT. RESUSPENSION OF AORTIC VALVE. HYPOTHERMIC CIRCULATORY ARREST WITH ANTEGRADE CEREBRAL PERFUSION. (N/A) INTRAOPERATIVE TRANSESOPHAGEAL ECHOCARDIOGRAM CORONARY ARTERY BYPASS GRAFTING (CABG) x1 WITH ENDOSCOPIC VEIN HARVEST (RIGHT THIGH). SVG TO THE RIGHT CORONARY ARTERY. (N/A)   Waking up on vent NSR w/ stable hemodynamics on low dose milrinone and dopamine, now on NTG for hypertension O2 sats 100% Chest tube output low UOP adequate Labs okay  Plan: Continue to wean vent and extubate per routine  Purcell Nails, MD 06/20/2018 6:33 PM

## 2018-06-20 NOTE — Progress Notes (Signed)
EKG CRITICAL VALUE     12 lead EKG performed.  Critical value noted.  Ivy Lynn, RN notified.   Rachel Bo, CCT 06/20/2018 10:32 AM

## 2018-06-20 NOTE — Procedures (Signed)
Extubation Procedure Note  Patient Details:   Name: Timothy West DOB: [birth year] MRN: [id number]   Airway Documentation:    Vent end date: 06/20/18 Vent end time: 1850   Evaluation  O2 sats: stable throughout Complications: No apparent complications Patient did tolerate procedure well. Bilateral Breath Sounds: Clear   Yes pt able to vocalize.   Pt extubated at this time per Rapid wean protocol. Nif and VC performed with -20cmH2O and 0.95L. Pt was able to breathe around deflated cuff. Pt placed on 6L . No stridor noted. Pt has strong, adequate cough.   Timothy West [facility] 06/20/2018, 6:56 PM

## 2018-06-20 NOTE — Progress Notes (Signed)
Dr. Tyrone Sage rounded on patient. Stated ok to remove one of patient's arterial lines. Ok with weaning ventilator, but wants patient to be fully awake before extubating. RT made aware. Precedex drip currently off.   Leanna Battles, RN

## 2018-06-20 NOTE — OR Nursing (Signed)
07:15am - Notified SICU nurse - 45 minute call

## 2018-06-20 NOTE — OR Nursing (Signed)
07:45 - Per Dr. Tyrone Sage, Timothy West chest drain exchanged - of blood in previous [place]

## 2018-06-20 NOTE — Anesthesia Preprocedure Evaluation (Signed)
Anesthesia Evaluation  Patient identified by MRN, date of birth, ID band Patient confused    Reviewed: Patient's Chart, lab work & pertinent test results, Unable to perform ROS - Chart review only  Airway Mallampati: III  TM Distance: >3 FB Neck ROM: Limited    Dental  (+) Teeth Intact, Poor Dentition   Pulmonary Current Smoker,     + wheezing      Cardiovascular hypertension,  Rhythm:Regular Rate:Normal     Neuro/Psych    GI/Hepatic   Endo/Other    Renal/GU      Musculoskeletal   Abdominal   Peds  Hematology   Anesthesia Other Findings   Reproductive/Obstetrics                             Anesthesia Physical Anesthesia Plan  ASA: IV and emergent  Anesthesia Plan: General   Post-op Pain Management:    Induction: Intravenous, Cricoid pressure planned and Rapid sequence  PONV Risk Score and Plan: Ondansetron  Airway Management Planned: Oral ETT  Additional Equipment: Arterial line, CVP, PA Cath, 3D TEE and Ultrasound Guidance Line Placement  Intra-op Plan:   Post-operative Plan: Post-operative intubation/ventilation  Informed Consent: I have reviewed the patients History and Physical, chart, labs and discussed the procedure including the risks, benefits and alternatives for the proposed anesthesia with the patient or authorized representative who has indicated his/her understanding and acceptance.     Plan Discussed with: CRNA and Anesthesiologist  Anesthesia Plan Comments:         Anesthesia Quick Evaluation

## 2018-06-20 NOTE — Anesthesia Procedure Notes (Signed)
Central Venous Catheter Insertion Performed by: Kipp Brood, MD, anesthesiologist Start/End10/09/2017 10:10 PM, 06/19/2018 10:20 PM Position: supine Hand hygiene performed , maximum sterile barriers used  and Seldinger technique used Catheter size: 12 Fr Central line was placed.Triple lumen Procedure performed using ultrasound guided technique. Ultrasound Notes:anatomy identified, needle tip was noted to be adjacent to the nerve/plexus identified, no ultrasound evidence of intravascular and/or intraneural injection and image(s) printed for medical record Attempts: 1 Following insertion, line sutured, dressing applied and Biopatch. Post procedure assessment: blood return through all ports, free fluid flow and no air  Patient tolerated the procedure well with no immediate complications.

## 2018-06-20 NOTE — Progress Notes (Addendum)
Dr. Cornelius Moras called for pt BP on aline systolic 160's-170's maxed out on 100 mcg of Nitro, 5 mg of lopressor PRN given along with night time 12.5 dose given. Pt treated for pain.  Orders to cut dopamine off and PRN labetalol 10 mg. Will follow orders and continue to monitor.

## 2018-06-20 NOTE — Anesthesia Procedure Notes (Addendum)
Central Venous Catheter Insertion Performed by: Kipp Brood, MD, anesthesiologist Start/End10/09/2017 10:10 PM, 06/19/2018 10:20 PM Patient location: Pre-op. Preanesthetic checklist: patient identified, IV checked, site marked, risks and benefits discussed, surgical consent, monitors and equipment checked, pre-op evaluation, timeout performed and anesthesia consent Lidocaine 1% used for infiltration and patient sedated Hand hygiene performed  and maximum sterile barriers used  Catheter size: 8.5 Fr Sheath introducer Procedure performed using ultrasound guided technique. Ultrasound Notes:anatomy identified, needle tip was noted to be adjacent to the nerve/plexus identified, no ultrasound evidence of intravascular and/or intraneural injection and image(s) printed for medical record Attempts: 1 Following insertion, line sutured and dressing applied. Post procedure assessment: blood return through all ports, free fluid flow and no air  Patient tolerated the procedure well with no immediate complications.

## 2018-06-20 NOTE — Anesthesia Procedure Notes (Signed)
Central Venous Catheter Insertion Performed by: Kipp Brood, MD, anesthesiologist Start/End10/09/2017 10:10 PM, 06/20/2018 10:20 PM Patient location: OR. Preanesthetic checklist: patient identified, IV checked, site marked, risks and benefits discussed, surgical consent, monitors and equipment checked, pre-op evaluation, timeout performed and anesthesia consent Hand hygiene performed  and maximum sterile barriers used  PA cath was placed.Swan type:thermodilution Procedure performed without using ultrasound guided technique. Ultrasound Notes:anatomy identified, needle tip was noted to be adjacent to the nerve/plexus identified and no ultrasound evidence of intravascular and/or intraneural injection Attempts: 1 Following insertion, line sutured, dressing applied and Biopatch. Post procedure assessment: blood return through all ports  Patient tolerated the procedure well with no immediate complications.

## 2018-06-20 NOTE — Progress Notes (Signed)
Dr. Tyrone Sage rounded. Aware of patient's hypertension. Rec'd prn metoprolol and started on nitro drip. States to continue on current dopamine and milrinone doses overnight. Continuing with ventilator weaning per protocol.  Leanna Battles, RN

## 2018-06-20 NOTE — Progress Notes (Signed)
CRITICAL VALUE ALERT  Critical Value:  EKG: STEMI  Date & Time Notied: 10/2 1031  Provider Notified: Dr Tyrone Sage  Orders Received/Actions taken: none at this time

## 2018-06-20 NOTE — Brief Op Note (Addendum)
.       [address]       [place] [address]             [phone]     06/19/2018  4:55 AM  PATIENT:  Timothy West  57 y.o. male  PRE-OPERATIVE DIAGNOSIS:  ascending aortic dissection  POST-OPERATIVE DIAGNOSIS:  Same   PROCEDURE:  Procedure(s):  REPAIR OF ACUTE TYPE A AORTIC DISSECTION -Hemiarch replacement under hypothermic circulatory arrest and antegrade cerebral perfusion -Replacement of Ascending Aorta with Resuspension of Aortic Valve  CORONARY BYPASS GRAFTING x 1 -SVG to RCA  ENDOSCOPIC HARVEST GREATER SAPHENOUS VEIN Right Thigh  INTRAOPERATIVE TRANSESOPHAGEAL ECHOCARDIOGRAM  SURGEON:  Surgeon(s) and Role:    Delight Ovens, MD - Primary  PHYSICIAN ASSISTANT: Lowella Dandy PA-C  ANESTHESIA:   general  EBL:  500 mL   BLOOD ADMINISTERED: CELLSAVER,  FFP and PLTS  DRAINS: Mediastinal Chest Tubes   LOCAL MEDICATIONS USED:  NONE  SPECIMEN:  Source of Specimen:  Aortic Dissection Flap  DISPOSITION OF SPECIMEN:  PATHOLOGY  COUNTS:  YES   DICTATION: .Dragon Dictation  PLAN OF CARE: Admit to inpatient   PATIENT DISPOSITION:  ICU - intubated and hemodynamically stable.   Delay start of Pharmacological VTE agent (>24hrs) due to surgical blood loss or risk of bleeding: yes

## 2018-06-20 NOTE — OR Nursing (Signed)
07:35 - 20 minute call to SICU nurse

## 2018-06-21 ENCOUNTER — Encounter (HOSPITAL_COMMUNITY): Payer: Self-pay | Admitting: Cardiothoracic Surgery

## 2018-06-21 ENCOUNTER — Inpatient Hospital Stay (HOSPITAL_COMMUNITY): Payer: Medicaid Other

## 2018-06-21 DIAGNOSIS — L8990 Pressure ulcer of unspecified site, unspecified stage: Secondary | ICD-10-CM

## 2018-06-21 HISTORY — DX: Pressure ulcer of unspecified site, unspecified stage: L89.90

## 2018-06-21 LAB — GLUCOSE, CAPILLARY
Glucose-Capillary: 113 mg/dL — ABNORMAL HIGH (ref 70–99)
Glucose-Capillary: 113 mg/dL — ABNORMAL HIGH (ref 70–99)
Glucose-Capillary: 115 mg/dL — ABNORMAL HIGH (ref 70–99)
Glucose-Capillary: 116 mg/dL — ABNORMAL HIGH (ref 70–99)
Glucose-Capillary: 120 mg/dL — ABNORMAL HIGH (ref 70–99)
Glucose-Capillary: 121 mg/dL — ABNORMAL HIGH (ref 70–99)
Glucose-Capillary: 125 mg/dL — ABNORMAL HIGH (ref 70–99)
Glucose-Capillary: 126 mg/dL — ABNORMAL HIGH (ref 70–99)
Glucose-Capillary: 87 mg/dL (ref 70–99)

## 2018-06-21 LAB — COOXEMETRY PANEL
Carboxyhemoglobin: 1.2 % (ref 0.5–1.5)
Carboxyhemoglobin: 1.2 % (ref 0.5–1.5)
Methemoglobin: 1.7 % — ABNORMAL HIGH (ref 0.0–1.5)
Methemoglobin: 1.9 % — ABNORMAL HIGH (ref 0.0–1.5)
O2 Saturation: 96.7 %
O2 Saturation: 69.3 %
Total hemoglobin: 10.6 g/dL — ABNORMAL LOW (ref 12.0–16.0)
Total hemoglobin: 10.6 g/dL — ABNORMAL LOW (ref 12.0–16.0)

## 2018-06-21 LAB — BPAM FFP
BLOOD PRODUCT EXPIRATION DATE: 201910032359
BLOOD PRODUCT EXPIRATION DATE: 201910072359
BLOOD PRODUCT EXPIRATION DATE: 201910072359
BLOOD PRODUCT EXPIRATION DATE: 201910072359
Blood Product Expiration Date: 201910032359
Blood Product Expiration Date: 201910072359
Blood Product Expiration Date: 201910072359
Blood Product Expiration Date: 201910072359
ISSUE DATE / TIME: 201910020346
ISSUE DATE / TIME: 201910020417
ISSUE DATE / TIME: 201910020623
ISSUE DATE / TIME: 201910020623
ISSUE DATE / TIME: 201910020346
ISSUE DATE / TIME: 201910020417
ISSUE DATE / TIME: 201910020623
ISSUE DATE / TIME: 201910020623
UNIT TYPE AND RH: 5100
UNIT TYPE AND RH: 5100
UNIT TYPE AND RH: 5100
UNIT TYPE AND RH: 5100
UNIT TYPE AND RH: 5100
UNIT TYPE AND RH: 5100
Unit Type and Rh: 5100
Unit Type and Rh: 5100

## 2018-06-21 LAB — PREPARE FRESH FROZEN PLASMA
UNIT DIVISION: 0
UNIT DIVISION: 0
UNIT DIVISION: 0
UNIT DIVISION: 0
Unit division: 0
Unit division: 0
Unit division: 0
Unit division: 0

## 2018-06-21 LAB — PREPARE PLATELET PHERESIS
UNIT DIVISION: 0
Unit division: 0

## 2018-06-21 LAB — CREATININE, SERUM
Creatinine, Ser: 0.92 mg/dL (ref 0.61–1.24)
GFR calc non Af Amer: >60 mL/min (ref >60)

## 2018-06-21 LAB — BASIC METABOLIC PANEL
Anion gap: 4 — ABNORMAL LOW (ref 5–15)
BUN: 13 mg/dL (ref 6–20)
CO2: 26 mmol/L (ref 22–32)
Calcium: 7.3 mg/dL — ABNORMAL LOW (ref 8.9–10.3)
Chloride: 110 mmol/L (ref 98–111)
Creatinine, Ser: 1.06 mg/dL (ref 0.61–1.24)
GFR calc Af Amer: >60 mL/min (ref >60)
GFR calc non Af Amer: >60 mL/min (ref >60)
Glucose, Bld: 125 mg/dL — ABNORMAL HIGH (ref 70–99)
Potassium: 3.8 mmol/L (ref 3.5–5.1)
Sodium: 140 mmol/L (ref 135–145)

## 2018-06-21 LAB — POCT I-STAT, CHEM 8
BUN: 12 mg/dL (ref 6–20)
Calcium, Ion: 1.06 mmol/L — ABNORMAL LOW (ref 1.15–1.40)
Chloride: 101 mmol/L (ref 98–111)
Creatinine, Ser: 0.8 mg/dL (ref 0.61–1.24)
Glucose, Bld: 121 mg/dL — ABNORMAL HIGH (ref 70–99)
HCT: 30 % — ABNORMAL LOW (ref 39.0–52.0)
Hemoglobin: 10.2 g/dL — ABNORMAL LOW (ref 13.0–17.0)
Potassium: 3.6 mmol/L (ref 3.5–5.1)
Sodium: 138 mmol/L (ref 135–145)
TCO2: 23 mmol/L (ref 22–32)

## 2018-06-21 LAB — PREPARE CRYOPRECIPITATE
UNIT DIVISION: 0
Unit division: 0

## 2018-06-21 LAB — CBC
HCT: 31.3 % — ABNORMAL LOW (ref 39.0–52.0)
HCT: 32.3 % — ABNORMAL LOW (ref 39.0–52.0)
Hemoglobin: 10.4 g/dL — ABNORMAL LOW (ref 13.0–17.0)
Hemoglobin: 10.5 g/dL — ABNORMAL LOW (ref 13.0–17.0)
MCH: 30.1 pg (ref 26.0–34.0)
MCH: 29.7 pg (ref 26.0–34.0)
MCHC: 33.2 g/dL (ref 30.0–36.0)
MCHC: 32.5 g/dL (ref 30.0–36.0)
MCV: 90.7 fL (ref 78.0–100.0)
MCV: 91.2 fL (ref 78.0–100.0)
PLATELETS: 79 10*3/uL — AB (ref 150–400)
Platelets: 80 10*3/uL — ABNORMAL LOW (ref 150–400)
RBC: 3.45 MIL/uL — ABNORMAL LOW (ref 4.22–5.81)
RBC: 3.54 MIL/uL — ABNORMAL LOW (ref 4.22–5.81)
RDW: 15 % (ref 11.5–15.5)
RDW: 14.6 % (ref 11.5–15.5)
WBC: 16.8 10*3/uL — ABNORMAL HIGH (ref 4.0–10.5)
WBC: 21 10*3/uL — AB (ref 4.0–10.5)

## 2018-06-21 LAB — BPAM CRYOPRECIPITATE
BLOOD PRODUCT EXPIRATION DATE: 201910020941
Blood Product Expiration Date: 201910020941
ISSUE DATE / TIME: 201910020414
ISSUE DATE / TIME: 201910020414
UNIT TYPE AND RH: 5100
Unit Type and Rh: 5100

## 2018-06-21 LAB — MAGNESIUM: Magnesium: 1.9 mg/dL (ref 1.7–2.4)

## 2018-06-21 LAB — BPAM PLATELET PHERESIS
BLOOD PRODUCT EXPIRATION DATE: 201910032359
Blood Product Expiration Date: 201910032359
ISSUE DATE / TIME: 201910020348
ISSUE DATE / TIME: 201910020348
Unit Type and Rh: 7300
Unit Type and Rh: 7300

## 2018-06-21 MED ORDER — METOPROLOL TARTRATE 25 MG PO TABS
25.0000 mg | ORAL_TABLET | Freq: Two times a day (BID) | ORAL | Status: DC
  Administered 2018-06-21 – 2018-06-24 (×7): 25 mg via ORAL
  Filled 2018-06-21 (×7): qty 1

## 2018-06-21 MED ORDER — GUAIFENESIN ER 600 MG PO TB12
600.0000 mg | ORAL_TABLET | Freq: Two times a day (BID) | ORAL | Status: DC
  Administered 2018-06-21 – 2018-06-28 (×14): 600 mg via ORAL
  Filled 2018-06-21 (×14): qty 1

## 2018-06-21 MED ORDER — ASPIRIN 81 MG PO CHEW
81.0000 mg | CHEWABLE_TABLET | Freq: Every day | ORAL | Status: DC
  Filled 2018-06-21: qty 1

## 2018-06-21 MED ORDER — NITROGLYCERIN 2 % TD OINT
1.0000 [in_us] | TOPICAL_OINTMENT | Freq: Four times a day (QID) | TRANSDERMAL | Status: DC
  Administered 2018-06-21 – 2018-06-22 (×3): 1 [in_us] via TOPICAL
  Filled 2018-06-21: qty 30

## 2018-06-21 MED ORDER — ASPIRIN EC 81 MG PO TBEC
81.0000 mg | DELAYED_RELEASE_TABLET | Freq: Every day | ORAL | Status: DC
  Administered 2018-06-22 – 2018-06-25 (×4): 81 mg via ORAL
  Filled 2018-06-21 (×4): qty 1

## 2018-06-21 MED ORDER — LEVALBUTEROL HCL 0.63 MG/3ML IN NEBU
0.6300 mg | INHALATION_SOLUTION | Freq: Three times a day (TID) | RESPIRATORY_TRACT | Status: DC
  Administered 2018-06-21 – 2018-06-27 (×18): 0.63 mg via RESPIRATORY_TRACT
  Filled 2018-06-21 (×19): qty 3

## 2018-06-21 MED ORDER — HYDRALAZINE HCL 25 MG PO TABS
25.0000 mg | ORAL_TABLET | Freq: Four times a day (QID) | ORAL | Status: DC
  Administered 2018-06-21 (×4): 25 mg via ORAL
  Filled 2018-06-21 (×4): qty 1

## 2018-06-21 MED ORDER — FUROSEMIDE 10 MG/ML IJ SOLN: 40.0000 mg | Freq: Once | INTRAMUSCULAR | Status: DC

## 2018-06-21 MED ORDER — AMLODIPINE BESYLATE 5 MG PO TABS: 5.0000 mg | ORAL_TABLET | Freq: Every day | ORAL | Status: DC

## 2018-06-21 MED ORDER — FUROSEMIDE 10 MG/ML IJ SOLN
40.0000 mg | Freq: Once | INTRAMUSCULAR | Status: AC
  Administered 2018-06-21: 40 mg via INTRAVENOUS
  Filled 2018-06-21: qty 4

## 2018-06-21 MED ORDER — INSULIN ASPART 100 UNIT/ML ~~LOC~~ SOLN
0.0000 [IU] | SUBCUTANEOUS | Status: DC
  Administered 2018-06-21: 2 [IU] via SUBCUTANEOUS

## 2018-06-21 MED ORDER — METOPROLOL TARTRATE 25 MG/10 ML ORAL SUSPENSION: 25.0000 mg | Freq: Two times a day (BID) | ORAL | Status: DC

## 2018-06-21 MED ORDER — INSULIN ASPART 100 UNIT/ML ~~LOC~~ SOLN: 0.0000 [IU] | SUBCUTANEOUS | Status: DC

## 2018-06-21 MED ORDER — LEVALBUTEROL HCL 0.63 MG/3ML IN NEBU: 0.6300 mg | INHALATION_SOLUTION | Freq: Three times a day (TID) | RESPIRATORY_TRACT | Status: DC

## 2018-06-21 NOTE — Plan of Care (Signed)
  Problem: Education: Goal: Knowledge of General Education information will improve Description Including pain rating scale, medication(s)/side effects and non-pharmacologic comfort measures Outcome: Progressing   Problem: Respiratory: Goal: Respiratory status will improve Outcome: Progressing   Problem: Skin Integrity: Goal: Wound healing without signs and symptoms of infection Outcome: Progressing   Problem: Clinical Measurements: Goal: Ability to maintain clinical measurements within normal limits will improve Outcome: Progressing

## 2018-06-21 NOTE — Plan of Care (Signed)
  Problem: Education: Goal: Knowledge of disease or condition will improve Outcome: Progressing   Problem: Activity: Goal: Risk for activity intolerance will decrease Outcome: Progressing   Problem: Cardiac: Goal: Will achieve and/or maintain hemodynamic stability Outcome: Progressing   Problem: Clinical Measurements: Goal: Postoperative complications will be avoided or minimized Outcome: Progressing   Problem: Respiratory: Goal: Respiratory status will improve Outcome: Progressing   Problem: Skin Integrity: Goal: Wound healing without signs and symptoms of infection Outcome: Progressing Goal: Risk for impaired skin integrity will decrease Outcome: Progressing   Problem: Urinary Elimination: Goal: Ability to achieve and maintain adequate renal perfusion and functioning will improve Outcome: Progressing   Problem: Respiratory: Goal: Respiratory status will improve Outcome: Progressing   Problem: Skin Integrity: Goal: Wound healing without signs and symptoms of infection Outcome: Progressing

## 2018-06-21 NOTE — Progress Notes (Signed)
RN A Edlyn Rosenburg assuming care of patient.  

## 2018-06-21 NOTE — Progress Notes (Signed)
CT surgery p.m. Rounds  Patient feeling stronger this p.m. Patient remains on oxygen with saturation 92%. P.m. dose of Lasix ordered. Blood pressure controlled. Good bilateral leg strength and warm extremities

## 2018-06-21 NOTE — Anesthesia Postprocedure Evaluation (Signed)
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 REPLACEMENT OF AORTA (HEMI ARCH) WITH  STRAIGHT HEMASHIELD PLATINUM GRAFT. RESUSPENSION OF AORTIC VALVE. HYPOTHERMIC CIRCULATORY ARREST WITH ANTEGRADE CEREBRAL PERFUSION. (N/A Chest) INTRAOPERATIVE TRANSESOPHAGEAL ECHOCARDIOGRAM (Esophagus) CORONARY ARTERY BYPASS GRAFTING (CABG) x1 WITH ENDOSCOPIC VEIN HARVEST (RIGHT THIGH). SVG TO THE RIGHT CORONARY ARTERY. (N/A Chest)     Patient location during evaluation: SICU Anesthesia Type: General Level of consciousness: sedated and patient remains intubated per anesthesia plan Pain management: pain level controlled Vital Signs Assessment: post-procedure vital signs reviewed and stable Respiratory status: patient remains intubated per anesthesia plan and patient on ventilator - see flowsheet for VS Cardiovascular status: stable Postop Assessment: no apparent nausea or vomiting Anesthetic complications: no    Last Vitals:  Vitals:   06/21/18 2000 06/21/18 2010  BP: 120/74   Pulse: (!) 107   Resp: 16   Temp:  37 C  SpO2: 93%     Last Pain:  Vitals:   06/21/18 2020  TempSrc:   PainSc: 7                  Caleen Taaffe COKER

## 2018-06-21 NOTE — Progress Notes (Addendum)
[facility] DAILY ICU PROGRESS NOTE                   [address]            [place], [address]          [phone]   2 Days Post-Op Procedure(s) (LRB): REPAIR OF ACUTE TYPE I ASCENDING AORTIC DISSECTION. REPLACEMENT OF AORTA (HEMI ARCH) WITH  32MM STRAIGHT HEMASHIELD PLATINUM GRAFT. RESUSPENSION OF AORTIC VALVE. HYPOTHERMIC CIRCULATORY ARREST WITH ANTEGRADE CEREBRAL PERFUSION. (N/A) INTRAOPERATIVE TRANSESOPHAGEAL ECHOCARDIOGRAM CORONARY ARTERY BYPASS GRAFTING (CABG) x1 WITH ENDOSCOPIC VEIN HARVEST (RIGHT THIGH). SVG TO THE RIGHT CORONARY ARTERY. (N/A)  Total Length of Stay:  LOS: 1 day   Subjective:  Patient complains of some pain.  Relieved with pain medication.  He denies nausea.  He is now able to move his lower extremities.  + Flatus  Objective: Vital signs in last 24 hours: Temp:  [94.5 F (34.7 C)-100.8 F (38.2 C)] 98.6 F (37 C) (10/03 0715) Pulse Rate:  [81-114] 90 (10/03 0715) Cardiac Rhythm: Normal sinus rhythm (10/03 0400) Resp:  [13-35] 16 (10/03 0715) BP: (86-175)/(62-98) 119/70 (10/03 0700) SpO2:  [89 %-100 %] 93 % (10/03 0715) Arterial Line BP: (87-206)/(42-86) 129/61 (10/03 0715) FiO2 (%):  [40 %-80 %] 40 % (10/02 1812)  Filed Weights   06/19/18 2030  Weight: 90.7 kg    Weight change:    Hemodynamic parameters for last 24 hours: PAP: (14-39)/(4-20) 28/19 CVP:  [6 mmHg-16 mmHg] 7 mmHg CO:  [3.9 L/min-8.2 L/min] 4.8 L/min CI:  [2.1 L/min/m2-4.5 L/min/m2] 2.6 L/min/m2  Intake/Output from previous day: 10/02 0701 - 10/03 0700 In: 4793.1 [I.V.:3808.9; IV Piggyback:979.1] Out: 3295 [Urine:2185; Emesis/NG output:350; Chest Tube:760]  Current Meds: Scheduled Meds: . acetaminophen  1,000 mg Oral Q6H   Or  . acetaminophen (TYLENOL) oral liquid 160 mg/5 mL  1,000 mg Per Tube Q6H  . amLODipine  5 mg Oral Daily  . aspirin EC  325 mg Oral Daily   Or  . aspirin  324 mg Per Tube Daily  . bisacodyl  10 mg Oral Daily   Or  . bisacodyl  10 mg  Rectal Daily  . chlorhexidine gluconate (MEDLINE KIT)  15 mL Mouth Rinse BID  . Chlorhexidine Gluconate Cloth  6 each Topical Daily  . docusate sodium  200 mg Oral Daily  . insulin aspart  0-24 Units Subcutaneous Q4H  . metoprolol tartrate  12.5 mg Oral BID   Or  . metoprolol tartrate  12.5 mg Per Tube BID  . [START ON 06/22/2018] pantoprazole  40 mg Oral Daily  . sodium chloride flush  10-40 mL Intracatheter Q12H  . sodium chloride flush  3 mL Intravenous Q12H   Continuous Infusions: . sodium chloride 20 mL/hr at 06/21/18 0700  . sodium chloride    . sodium chloride 20 mL/hr at 06/20/18 1705  . albumin human 12.5 g (06/20/18 0826)  . cefUROXime (ZINACEF)  IV Stopped (06/20/18 2254)  . dexmedetomidine (PRECEDEX) IV infusion Stopped (06/21/18 0636)  . DOPamine Stopped (06/20/18 2246)  . famotidine (PEPCID) IV Stopped (06/20/18 1000)  . lactated ringers    . lactated ringers    . lactated ringers 20 mL/hr at 06/21/18 0700  . milrinone 0.25 mcg/kg/min (06/21/18 0700)  . nitroGLYCERIN 30 mcg/min (06/21/18 0700)  . norepinephrine (LEVOPHED) Adult infusion Stopped (06/20/18 1442)  . phenylephrine (NEO-SYNEPHRINE) Adult infusion     PRN Meds:.sodium chloride, acetaminophen (TYLENOL) oral liquid 160 mg/5 mL,  albumin human, labetalol, lactated ringers, metoprolol tartrate, midazolam, morphine injection, ondansetron (ZOFRAN) IV, oxyCODONE, sodium chloride flush, sodium chloride flush, traMADol  General appearance: alert, cooperative and no distress Heart: regular rate and rhythm Lungs: diminished breath sounds bibasilar Abdomen: soft, non-tender; bowel sounds normal; no masses,  no organomegaly Extremities: edema trace, + movement Wound: clean and dry EVH site, aquacel in place  Lab Results: CBC: Recent Labs    06/20/18 1404 06/20/18 1406 06/21/18 0400  WBC 12.0*  --  16.8*  HGB 11.9* 10.9* 10.4*  HCT 35.4* 32.0* 31.3*  PLT 94*  --  80*   BMET:  Recent Labs    06/20/18 0826   06/20/18 1406 06/21/18 0400  NA 142  --  144 140  K 3.7  --  4.1 3.8  CL 107  --  106 110  CO2 27  --   --  26  GLUCOSE 207*  --  103* 125*  BUN 9  --  12 13  CREATININE 1.18   < > 1.00 1.06  CALCIUM 8.2*  --   --  7.3*   < > = values in this interval not displayed.    CMET: Lab Results  Component Value Date   WBC 16.8 (H) 06/21/2018   HGB 10.4 (L) 06/21/2018   HCT 31.3 (L) 06/21/2018   PLT 80 (L) 06/21/2018   GLUCOSE 125 (H) 06/21/2018   ALT 10 06/19/2018   AST 19 06/19/2018   NA 140 06/21/2018   K 3.8 06/21/2018   CL 110 06/21/2018   CREATININE 1.06 06/21/2018   BUN 13 06/21/2018   CO2 26 06/21/2018   INR 0.95 06/20/2018      PT/INR:  Recent Labs    06/20/18 0826  LABPROT 12.5  INR 0.95   Radiology: Dg Chest Port 1 View  Result Date: 06/20/2018 CLINICAL DATA:  Status post ascending aortic replacement and CABG. EXAM: PORTABLE CHEST 1 VIEW COMPARISON:  06/19/2018 FINDINGS: Sequelae of interval CABG and ascending aortic replacement are identified. Endotracheal tube terminates at the clavicular heads. A right jugular catheter terminates over the mid SVC. A right jugular Swan-Ganz catheter terminates over the main pulmonary outflow tract. An enteric tube terminates in the left upper abdomen with tip at the level of the gastric fundus. Mediastinal drains are present. The mediastinum remains widened with hematoma shown on CT. Lung volumes are lower than on the prior study, and there is mild central pulmonary vascular congestion and mild atelectasis bilaterally. No sizable pleural effusion or pneumothorax is identified. IMPRESSION: 1. Support devices as above. 2. Lower lung volumes with mild central pulmonary vascular congestion and bilateral atelectasis. Electronically Signed   By: Logan Bores M.D.   On: 06/20/2018 09:02     Assessment/Plan: S/P Procedure(s) (LRB): REPAIR OF ACUTE TYPE I ASCENDING AORTIC DISSECTION. REPLACEMENT OF AORTA (HEMI ARCH) WITH  32MM STRAIGHT  HEMASHIELD PLATINUM GRAFT. RESUSPENSION OF AORTIC VALVE. HYPOTHERMIC CIRCULATORY ARREST WITH ANTEGRADE CEREBRAL PERFUSION. (N/A) INTRAOPERATIVE TRANSESOPHAGEAL ECHOCARDIOGRAM CORONARY ARTERY BYPASS GRAFTING (CABG) x1 WITH ENDOSCOPIC VEIN HARVEST (RIGHT THIGH). SVG TO THE RIGHT CORONARY ARTERY. (N/A)  1. CV- NSR, + HTN- on Milrinone, Co-ox at 69.3- remains on Milrinone, Dopamine stopped overnight, started on NTG for hypertension, will add Hydralazine for additional BP control, would avoid ACE/ARB for now as patient had decreased perfusion to kidneys prior to surgery 2. Pulm- wheezing on exam, CT out at 850 since surgery- will leave in place today, start Xopenex nebs, continue IS 3. Renal- creatinine at 1.06,  decreased U/O since cessation of Dopamine, will restart, no lasix for now 4. Expected post operative blood loss anemia, mild Hgb at 10.4 5. Expected post operative thrombocytopenia, mild at 80, will monitor 6. Dispo- patient stable, will discuss resumption of Dopamine with Dr. Servando Snare, Hydralazine for BP control, , POD #1 progression orders     Ellwood Handler 06/21/2018 7:46 AM   Patient awake and alert, neuro intact, nl sensation lower extremities , moving feet and legs much better then preop I have seen and examined Timothy West and agree with the above assessment  and plan.  Grace Isaac MD [facility] [phone] Office [phone] 06/21/2018 12:24 PM

## 2018-06-22 ENCOUNTER — Inpatient Hospital Stay: Payer: Self-pay

## 2018-06-22 ENCOUNTER — Inpatient Hospital Stay (HOSPITAL_COMMUNITY): Payer: Medicaid Other

## 2018-06-22 LAB — GLUCOSE, CAPILLARY
Glucose-Capillary: 103 mg/dL — ABNORMAL HIGH (ref 70–99)
Glucose-Capillary: 108 mg/dL — ABNORMAL HIGH (ref 70–99)
Glucose-Capillary: 111 mg/dL — ABNORMAL HIGH (ref 70–99)
Glucose-Capillary: 117 mg/dL — ABNORMAL HIGH (ref 70–99)
Glucose-Capillary: 87 mg/dL (ref 70–99)

## 2018-06-22 LAB — BASIC METABOLIC PANEL
Anion gap: 6 (ref 5–15)
BUN: 12 mg/dL (ref 6–20)
CO2: 29 mmol/L (ref 22–32)
Calcium: 7.7 mg/dL — ABNORMAL LOW (ref 8.9–10.3)
Chloride: 101 mmol/L (ref 98–111)
Creatinine, Ser: 0.93 mg/dL (ref 0.61–1.24)
GFR calc Af Amer: >60 mL/min (ref >60)
GFR calc non Af Amer: >60 mL/min (ref >60)
Glucose, Bld: 115 mg/dL — ABNORMAL HIGH (ref 70–99)
Potassium: 3.9 mmol/L (ref 3.5–5.1)
Sodium: 136 mmol/L (ref 135–145)

## 2018-06-22 LAB — CBC
HEMATOCRIT: 31.3 % — AB (ref 39.0–52.0)
HEMOGLOBIN: 10.2 g/dL — AB (ref 13.0–17.0)
MCH: 30.4 pg (ref 26.0–34.0)
MCHC: 32.6 g/dL (ref 30.0–36.0)
MCV: 93.2 fL (ref 78.0–100.0)
Platelets: 75 10*3/uL — ABNORMAL LOW (ref 150–400)
RBC: 3.36 MIL/uL — AB (ref 4.22–5.81)
RDW: 14.3 % (ref 11.5–15.5)
WBC: 20.1 10*3/uL — AB (ref 4.0–10.5)

## 2018-06-22 LAB — COOXEMETRY PANEL
Carboxyhemoglobin: 1 % (ref 0.5–1.5)
Carboxyhemoglobin: 1.1 % (ref 0.5–1.5)
Methemoglobin: 1.1 % (ref 0.0–1.5)
Methemoglobin: 1.4 % (ref 0.0–1.5)
O2 Saturation: 32.5 %
O2 Saturation: 51.3 %
Total hemoglobin: 11.5 g/dL — ABNORMAL LOW (ref 12.0–16.0)
Total hemoglobin: 13.4 g/dL (ref 12.0–16.0)

## 2018-06-22 MED ORDER — HYDRALAZINE HCL 50 MG PO TABS
50.0000 mg | ORAL_TABLET | Freq: Four times a day (QID) | ORAL | Status: DC
  Administered 2018-06-22 – 2018-06-24 (×8): 50 mg via ORAL
  Filled 2018-06-22 (×9): qty 1

## 2018-06-22 MED ORDER — CHLORHEXIDINE GLUCONATE CLOTH 2 % EX PADS
6.0000 | MEDICATED_PAD | Freq: Every day | CUTANEOUS | Status: DC
  Administered 2018-06-23 – 2018-06-27 (×5): 6 via TOPICAL

## 2018-06-22 MED ORDER — INSULIN ASPART 100 UNIT/ML ~~LOC~~ SOLN
0.0000 [IU] | SUBCUTANEOUS | Status: DC
  Administered 2018-06-23: 4 [IU] via SUBCUTANEOUS

## 2018-06-22 MED ORDER — FUROSEMIDE 10 MG/ML IJ SOLN
40.0000 mg | Freq: Once | INTRAMUSCULAR | Status: AC
  Administered 2018-06-22: 40 mg via INTRAVENOUS
  Filled 2018-06-22: qty 4

## 2018-06-22 MED ORDER — SODIUM CHLORIDE 0.9% FLUSH
10.0000 mL | Freq: Two times a day (BID) | INTRAVENOUS | Status: DC
  Administered 2018-06-22 – 2018-06-28 (×9): 10 mL

## 2018-06-22 MED ORDER — SODIUM CHLORIDE 0.9% FLUSH: 10.0000 mL | INTRAVENOUS | Status: DC | PRN

## 2018-06-22 MED FILL — Heparin Sodium (Porcine) Inj 1000 Unit/ML: INTRAMUSCULAR | Qty: 30 | Status: AC

## 2018-06-22 MED FILL — Sodium Bicarbonate IV Soln 8.4%: INTRAVENOUS | Qty: 150 | Status: AC

## 2018-06-22 MED FILL — Mannitol IV Soln 20%: INTRAVENOUS | Qty: 500 | Status: AC

## 2018-06-22 MED FILL — Electrolyte-R (PH 7.4) Solution: INTRAVENOUS | Qty: 3000 | Status: AC

## 2018-06-22 MED FILL — Lidocaine HCl(Cardiac) IV PF Soln Pref Syr 100 MG/5ML (2%): INTRAVENOUS | Qty: 5 | Status: AC

## 2018-06-22 MED FILL — Heparin Sodium (Porcine) Inj 1000 Unit/ML: INTRAMUSCULAR | Qty: 10 | Status: AC

## 2018-06-22 NOTE — Care Management Note (Addendum)
Case Management Note  Patient Details  Name: Timothy West MRN: [id number] Date of Birth: August 08, 1961  Subjective/Objective: 57 yo male presented with CP; s/p CABG x 1; Repair of Acute type I aortic dissection                   Action/Plan: CM attempted x 2 to meet with patient to discuss transitional needs, with patient sleeping. Patient listed in Epic as uninsured with no PCP. Referral made to the financial counselor by Crawford Givens, CSW, who also met with patient's mother regarding applying for Medicaid, Food Stamps and disability. CM to continue to follow to assist with needs.   Expected Discharge Date:                  Expected Discharge Plan:  Home/Self Care  In-House Referral:  Clinical Social Work, Scientist, research (medical)  CM Consult, Follow-up appt scheduled, Medication Assistance  Post Acute Care Choice:  NA Choice offered to:  NA  DME Arranged:  N/A DME Agency:  NA  HH Arranged:  NA HH Agency:  NA  Status of Service:  In process, will continue to follow  If discussed at Long Length of Stay Meetings, dates discussed:    Additional Comments 06/25/18 @ 1151-Rinoa Garramone RNCM-CM met with patient to discuss transitional care needs. Patient reported being independent and living at home alone PTA with no DME in use. Patient indicated to CM not having a local PCP or health insurance, but was agreeable to CM arranging a hospital f/u appointment in [place]. Patient verbalized his mother lives 15 minutes from his residence and would be available to check on his progress post transition, provide transportation home and also to his f/u appointments. CM arranged a hospital f/u appointment at: [facility] on July 04, 2018 @ 1p; CM discussed [facility] for Rx needs for $4-10 medications, with patient verbalized being able to afford with AVS updated. CM will monitor discharge Rx costs once determined for possible MATCH via the [place]  service if patient states he's unable to afford costs prior to transition home. CM will continue to follow for needs.  Midge Minium RN, BSN, NCM-BC, ACM-RN [phone] 06/22/2018, 3:07 PM

## 2018-06-22 NOTE — Clinical Social Work Note (Signed)
CSW advised that mother of patient requesting to speak with a Education officer, museum. Met with Ms. Ghosh in waiting area for 2H and Mrs. Whidby had questions regarding applying for Medicaid, Food Stams, and disability and this was discussed. Mother informed that financial counselor will be contacted regarding Medicaid and Disability applications as patient has no insurance. Mrs. Fitzmaurice advised that she will need to go to the Department of Social Services to apply for [employer]. Call made to financial counselor Letta Pate [phone] and message left.  Taylie Helder Givens, MSW, LCSW Licensed Clinical Social Worker [place] [phone]

## 2018-06-22 NOTE — Progress Notes (Signed)
Peripherally Inserted Central Catheter/Midline Placement  The IV Nurse has discussed with the patient and/or persons authorized to consent for the patient, the purpose of this procedure and the potential benefits and risks involved with this procedure.  The benefits include less needle sticks, lab draws from the catheter, and the patient may be discharged home with the catheter. Risks include, but not limited to, infection, bleeding, blood clot (thrombus formation), and puncture of an artery; nerve damage and irregular heartbeat and possibility to perform a PICC exchange if needed/ordered by physician.  Alternatives to this procedure were also discussed.  Bard Power PICC patient education guide, fact sheet on infection prevention and patient information card has been provided to patient /or left at bedside.    PICC/Midline Placement Documentation  PICC Double Lumen 06/22/18 PICC Left Basilic 44 cm 0 cm (Active)  Indication for Insertion or Continuance of Line Prolonged intravenous therapies 06/22/2018 10:49 AM  Exposed Catheter (cm) 0 cm 06/22/2018 10:49 AM  Site Assessment Clean;Dry;Intact 06/22/2018 10:49 AM  Lumen #1 Status Flushed;Blood return noted 06/22/2018 10:49 AM  Lumen #2 Status Flushed;Blood return noted 06/22/2018 10:49 AM  Dressing Type Transparent 06/22/2018 10:49 AM  Dressing Status Clean;Dry;Intact;Antimicrobial disc in place 06/22/2018 10:49 AM  Dressing Intervention New dressing 06/22/2018 10:49 AM  Dressing Change Due 06/29/18 06/22/2018 10:49 AM       Reginia Forts Albarece 06/22/2018, 10:51 AM

## 2018-06-22 NOTE — Progress Notes (Signed)
Patient ID: Timothy West, male   DOB: 10/27/1960, 57 y.o.   MRN: [id number] TCTS Evening Rounds:  Hemodynamically stable in sinus rhythm  Dopamine at 2 mcg Urine output has been good. sats 96%

## 2018-06-22 NOTE — Op Note (Signed)
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  SURGICAL PROCEDURE PERFORMED:  Repair of type 1 aortic dissection with resuspension of aortic valve, replacement of ascending aorta and hemiarch with a Hemashield 32 mm graft.  SURGICAL PROCEDURE:  Coronary artery bypass grafting x1 with reverse saphenous vein graft to the distal right coronary artery right thigh greater saphenous endoscopic vein harvest, hypothermic circulatory arrest with antegrade cerebral perfusion, right  axillary arterial cannulation.  SURGEON:  Sheliah Plane, MD  FIRST ASSISTANT: Dr Durene Cal  Second Assistant: Lowella Dandy, PA-C  BRIEF HISTORY:  The patient is a 57 year old male with previous history of hypertension, but no other cardiac history or family history of aortic dissection who while lying on the couch approximately 7:30 p.m. on the evening of the surgery began  developing severe chest pain.  He was aware that something was wrong.  He got up and tried to walk out of his house and drive himself to the emergency room when he noted that his legs had become very weak and he was unable to walk.  He called EMS and was  transported to [facility] from [facility].  On arrival, he was noted to be hypotensive, mottled in color.  His lower extremities were weak and had thready pulses.  Initial evaluation with ultrasound of the abdomen noted a flap in his abdominal  aorta.  A full CTA of the chest, abdomen and pelvis was then performed and he was immediately prepared for surgery and taken to the operating room.  The patient on initial exam was awake and alert.  He noted  numbness in his feet and lower extremities.   He could not bend his knees.  Plantar flexion and dorsiflexion of both lower extremities were weak, left weaker than the right.  He had palpable PT and DP pulses on the right and palpable PT pulse on the left.  On exam, he had evidence of aortic  insufficiency.  The urgency of surgical intervention was discussed with the patient and he was agreeable and signed informed consent, the patient was taken directly to the operating room.    DESCRIPTION OF PROCEDURE:  Timothy West and arterial lines in both the right and left arms were placed.  He underwent general endotracheal anesthesia.  TEE probe was placed and reviewed with Dr. Ezequiel Essex.  This showed preserved LV function.  Confirmed aortic  neck dissection with aortic insufficiency, primarily because of a flail cusp of the aortic valve between the noncoronary cusp and the right coronary cusp.  CT scan showed in the abdominal aorta, a very small true lumen, very small crescent, no flow to  the left kidney with contrast in the right kidney.  The chest and abdomen with legs were prepped with Betadine and draped in the usual sterile manner.  Appropriate timeout was performed.  We then initially proceeded with opening the right infraclavicular  deltopectoral groove separating the fibers of the pectoralis major, dividing the fibers of the pectoralis minor and dissecting down to the axillary artery.  The axillary artery was encircled with Vesseloops on CT scan.  The dissection had involved the  very proximal innominate artery and continued through the arch all the way to the bifurcation of the  distal aorta.  The patient was systemically heparinized.  Vascular clamps were placed proximally and distally on the axillary artery.  The artery was  opened.  An 8 mm Hemashield was anastomosed to the artery.  This was connected to the arterial inflow of the pump after deairing carefully.  Median sternotomy was performed.  There was  obvious significant hemorrhage throughout the mediastinum.  The  pericardium was opened.  A retrograde cardioplegic catheter was placed.  Dual stage venous cannula was placed.  There was significant hemorrhage into the tissues of the mediastinum, particularly surrounding the pulmonary arteries.  The patient was placed  on cardiopulmonary bypass 2.4 L/min per sq m. Right superior pulmonary vein vent was placed.  The patient was then cooled to 19 degrees.  The proximal innominate artery was dissected out.  We then proceeded as we approached 19 degrees to start  circulatory arrest.  A clamp was placed on the proximal innominate artery and antegrade cerebral perfusion was begun.  The ascending aorta was opened and a portion resected.  This gave [place] good exposure of significant dissection down into the root, on  careful inspection the root was not particularly enlarged.  The dissection involved along the cusp and commissure of the noncoronary cusp and the commissure between the noncoronary and right cusp.  The dissection then carried over and the avulsed the  origin of the right coronary artery.  The remainder of the aortic wall was intact distally.  A second tear was noted along the undersurface of the aortic arch.  This was excised.  At this point, we felt we did not need to place a stent into the  descending thoracic aorta.  Dr. Myra Gianotti assisted on this.  He came in scrubbed in the case.  We then began working on the distal anastomosis after we had resected the ascending aorta, preserving the takeoff of the innominate and the top surface of the  aorta.  The graft was then tailored and using a running 4-0 Prolene with felt strips, the undersurface of the arch and ascending aorta was placed with the 32 mm Hemashield graft.  During this time, there was significant flow returning from the left  carotid artery.  FloSeal was placed along the anastomosis.  BioGlue had been used sparingly to seal the layers of the  aorta.  We then slowly removed the clamp on the innominate artery restoring blood flow to the lower body and a crossclamp was placed on  the graft.  The anastomosis was intact and hemostatic.  We resumed full cardiopulmonary bypass.  Intermittently cold blood cardioplegia had been administered retrograde.  We then turned our attention to the proximal anastomosis.  It appeared that the  structure of the aortic valve could be resuspended however, repair of the takeoff of the right coronary was not possible.  This was oversewn.  We then harvested a piece of reverse saphenous vein graft from the right thigh endoscopically.  We sewed the  vein to the distal right coronary artery.  The commissure of the leaflet between the right coronary cusp and noncoronary cusps was pledgeted to the aortic wall.  We then over felt strip closed the proximal anastomoses with a running 4-0 Prolene.  A  single punch aortotomy was created in the graft and the vein graft to the distal right coronary artery was anastomosed to the ascending aorta.  The heart was allowed to passively fill and deair and the aortic crossclamp was removed from the graft.  The  patient required electrical defibrillation and returned to a sinus rhythm.  Body temperature was completely rewarmed to 37 degrees.  The anastomosis all appeared intact.  On TEE, the aortic valve had mild aortic insufficiency, very centrally related.   Atrial and ventricular pacing wires were applied.  The retrograde cardioplegic catheter was removed.  With the body temperature rewarmed, the right superior pulmonary vein vent was removed.  On low dose dopamine, the patient was then weaned from  cardiopulmonary bypass without difficulty.  He remained hemodynamically stable.  He was decannulated in the usual fashion.  Protamine sulfate was administered.  Because of generalized ooze low platelets, he was given fresh frozen and platelets,  cryoprecipitate and ultimately factor 7.  After  the protamine was administered and the patient was hemodynamically stable, the cannula that had been sewed to the right axillary artery was stapled across with a vascular stapler.  With the patient,  operative field hemostatic 2 Blake drains were left in the mediastinum.  The sternum was closed with #6 stainless steel wire.  Fascia closed with interrupted 0 Vicryl, running 3-0 Vicryl in subcutaneous tissue, 4-0 subcuticular stitch in skin edges.  The  right infraclavicular incision was closed by reapproximating the pectoralis muscle fibers running 2-0 Vicryl, subcutaneous tissue with 3-0 subcuticular stitch in skin edges.  Dry dressings were applied.  Drapes on the patient were removed.  He was  hemodynamically stable.  He had palpable pulses in his feet.  TEE showed reexpansion of the true lumen along the descending aorta that could be evaluated with a TEE probe.  As we were preparing to take the patient off the OR table, he without warning had  ventricular fibrillation.  He was quickly defibrillated and given epinephrine.  This caused his blood pressure rise significantly and then fell again.  The patient was immediately redraped and the incision and sternum opened.  There was significant  blood in the pericardium, a significant change from the 20 minutes before when he had been closed.  The blood was evacuated from the field.  It was obvious a fairly single point of bleeding from the proximal anastomosis.  Pressure was held on this  controlling bleeding as the patient was volume resuscitated.  Hematoma was removed.  Blood pressure improved and stabilized.  We then placed 2 pledgeted sutures in this area with complete control of the bleeding.  We observed without further bleeding.   The patient again remained hemodynamically stable after volume resuscitation and additional blood products were given.  Again, with the operative field hemostatic, the sternum was then reclosed with #6 stainless steel wire.   Fascia closed with  interrupted 0 Vicryl, running 3-0 Vicryl in subcutaneous tissue and 3-0 subcuticular stitch in skin edges.  Dry dressings were applied.  The patient at this point had adequate cardiac output was making urine and was hemodynamically stable and was then  transferred in stable condition to the surgical intensive care unit for further postoperative care.  The total crossclamp time was 68 minutes.  Circulatory cerebral perfusion time 66 minutes.  Total pump time 233 minutes.  TN/NUANCE  D:06/21/2018 T:06/22/2018 JOB:[id number]

## 2018-06-22 NOTE — Progress Notes (Addendum)
Patient ID: Timothy West, male   DOB: 01/06/1961, 57 y.o.   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 REPLACEMENT OF AORTA (HEMI ARCH) WITH  32MM STRAIGHT HEMASHIELD PLATINUM GRAFT. RESUSPENSION OF AORTIC VALVE. HYPOTHERMIC CIRCULATORY ARREST WITH ANTEGRADE CEREBRAL PERFUSION. (N/A) INTRAOPERATIVE TRANSESOPHAGEAL ECHOCARDIOGRAM CORONARY ARTERY BYPASS GRAFTING (CABG) x1 WITH ENDOSCOPIC VEIN HARVEST (RIGHT THIGH). SVG TO THE RIGHT CORONARY ARTERY. (N/A)  Total Length of Stay:  LOS: 2 days   Subjective:  Patient doing okay.  Continues to have some pain.  Denies N/V  Objective: Vital signs in last 24 hours: Temp:  [97.8 F (36.6 C)-99 F (37.2 C)] 97.8 F (36.6 C) (10/04 0406) Pulse Rate:  [87-110] 94 (10/04 0700) Cardiac Rhythm: Sinus tachycardia (10/04 0400) Resp:  [12-28] 20 (10/04 0700) BP: (92-165)/(61-85) 105/63 (10/04 0700) SpO2:  [81 %-95 %] 92 % (10/04 0700) Arterial Line BP: (128-204)/(47-73) 150/57 (10/03 1800) FiO2 (%):  [80 %-100 %] 80 % (10/04 0400) Weight:  [105.7 kg] 105.7 kg (10/04 0500)  Filed Weights   06/19/18 2030 06/22/18 0500  Weight: 90.7 kg 105.7 kg    Weight change:    Hemodynamic parameters for last 24 hours: PAP: (23-32)/(11-19) 23/12  Intake/Output from previous day: 10/03 0701 - 10/04 0700 In: 1923.4 [P.O.:820; I.V.:903.4; IV Piggyback:200] Out: 2250 [Urine:1910; Chest Tube:340]  Current Meds: Scheduled Meds: . acetaminophen  1,000 mg Oral Q6H   Or  . acetaminophen (TYLENOL) oral liquid 160 mg/5 mL  1,000 mg Per Tube Q6H  . aspirin EC  81 mg Oral Daily   Or  . aspirin  81 mg Per Tube Daily  . bisacodyl  10 mg Oral Daily   Or  . bisacodyl  10 mg Rectal Daily  . chlorhexidine gluconate (MEDLINE KIT)  15 mL Mouth Rinse BID  . Chlorhexidine  Gluconate Cloth  6 each Topical Daily  . docusate sodium  200 mg Oral Daily  . guaiFENesin  600 mg Oral BID  . hydrALAZINE  25 mg Oral QID  . insulin aspart  0-24 Units Subcutaneous Q4H  . levalbuterol  0.63 mg Nebulization TID  . metoprolol tartrate  25 mg Oral BID   Or  . metoprolol tartrate  25 mg Per Tube BID  . nitroGLYCERIN  1 inch Topical Q6H  . pantoprazole  40 mg Oral Daily  . sodium chloride flush  10-40 mL Intracatheter Q12H  . sodium chloride flush  3 mL Intravenous Q12H   Continuous Infusions: . sodium chloride Stopped (06/21/18 0900)  . sodium chloride    . sodium chloride 20 mL/hr at 06/20/18 1705  . DOPamine 2 mcg/kg/min (06/22/18 0600)  . lactated ringers    . lactated ringers    . lactated ringers 20 mL/hr at 06/22/18 0600  . nitroGLYCERIN 45 mcg/min (06/22/18 0600)  . phenylephrine (NEO-SYNEPHRINE) Adult infusion     PRN Meds:.sodium chloride, acetaminophen (TYLENOL) oral liquid 160 mg/5 mL, labetalol, lactated ringers, metoprolol tartrate, midazolam, morphine injection, ondansetron (ZOFRAN) IV, oxyCODONE, sodium chloride flush, sodium chloride flush, traMADol  General appearance: alert, cooperative and no distress Heart: regular rate and rhythm Lungs: clear to auscultation bilaterally Abdomen: soft, non-tender; bowel  sounds normal; no masses,  no organomegaly Extremities: edema trace Wound: clean and dry  Lab Results: CBC: Recent Labs    06/21/18 1707 06/21/18 1730 06/22/18 0525  WBC 21.0*  --  20.1*  HGB 10.5* 10.2* 10.2*  HCT 32.3* 30.0* 31.3*  PLT 79*  --  75*   BMET:  Recent Labs    06/21/18 0400  06/21/18 1730 06/22/18 0525  NA 140  --  138 136  K 3.8  --  3.6 3.9  CL 110  --  101 101  CO2 26  --   --  29  GLUCOSE 125*  --  121* 115*  BUN 13  --  12 12  CREATININE 1.06   < > 0.80 0.93  CALCIUM 7.3*  --   --  7.7*   < > = values in this interval not displayed.    CMET: Lab Results  Component Value Date   WBC 20.1 (H) 06/22/2018     HGB 10.2 (L) 06/22/2018   HCT 31.3 (L) 06/22/2018   PLT 75 (L) 06/22/2018   GLUCOSE 115 (H) 06/22/2018   ALT 10 06/19/2018   AST 19 06/19/2018   NA 136 06/22/2018   K 3.9 06/22/2018   CL 101 06/22/2018   CREATININE 0.93 06/22/2018   BUN 12 06/22/2018   CO2 29 06/22/2018   INR 0.95 06/20/2018      PT/INR:  Recent Labs    06/20/18 0826  LABPROT 12.5  INR 0.95   Radiology: No results found.   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 REPLACEMENT OF AORTA (HEMI ARCH) WITH  32MM STRAIGHT HEMASHIELD PLATINUM GRAFT. RESUSPENSION OF AORTIC VALVE. HYPOTHERMIC CIRCULATORY ARREST WITH ANTEGRADE CEREBRAL PERFUSION. (N/A) INTRAOPERATIVE TRANSESOPHAGEAL ECHOCARDIOGRAM CORONARY ARTERY BYPASS GRAFTING (CABG) x1 WITH ENDOSCOPIC VEIN HARVEST (RIGHT THIGH). SVG TO THE RIGHT CORONARY ARTERY. (N/A)  1. CV- NSR, remains on NTG drip for HTN- continue Lopressor at 25 mg BID, will increase Hydralazine to 50 mg QID, Coox is at 52 this morning 2. Pulm- wean oxygen as tolerated, CT output was around 400 yesterday, will likely d/c chest tubes today, continue IS 3. Renal- creatinine stable, U/O improved with resumption of Dopamine, continue IV diuretics as ordered 4. Expected post operative blood loss anemia, stable at 10.2 5. Expected post operative Thrombocytopenia, plt count dropped to 75 K, monitor if doesn't improve will need to obtain a HIT panel 6. CBGs controlled, will change CBGs to [facility] and HS 7. Dispo- patient stable, increase Hydralazine and hopefully wean off NTG drip, continue IV diuretics as creatinine is stable, Hgb remains stable, watch Platelet count, will hold on Lovenox today, will leave in ICU for further monitoring and weaning of drips      Grace Isaac 06/22/2018 7:12 AM

## 2018-06-23 ENCOUNTER — Inpatient Hospital Stay (HOSPITAL_COMMUNITY): Payer: Medicaid Other

## 2018-06-23 LAB — BPAM RBC
BLOOD PRODUCT EXPIRATION DATE: 201910252359
BLOOD PRODUCT EXPIRATION DATE: 201910252359
BLOOD PRODUCT EXPIRATION DATE: 201910262359
BLOOD PRODUCT EXPIRATION DATE: 201910282359
BLOOD PRODUCT EXPIRATION DATE: 201910292359
Blood Product Expiration Date: 201910252359
Blood Product Expiration Date: 201910262359
Blood Product Expiration Date: 201910272359
Blood Product Expiration Date: 201910272359
Blood Product Expiration Date: 201910272359
Blood Product Expiration Date: 201910272359
Blood Product Expiration Date: 201910292359
ISSUE DATE / TIME: 201910012306
ISSUE DATE / TIME: 201910012306
ISSUE DATE / TIME: 201910012306
ISSUE DATE / TIME: 201910012306
ISSUE DATE / TIME: 201910030941
ISSUE DATE / TIME: 201910031420
ISSUE DATE / TIME: 201910031649
ISSUE DATE / TIME: 201910031806
UNIT TYPE AND RH: 5100
UNIT TYPE AND RH: 5100
UNIT TYPE AND RH: 5100
UNIT TYPE AND RH: 5100
UNIT TYPE AND RH: 5100
UNIT TYPE AND RH: 5100
Unit Type and Rh: 5100
Unit Type and Rh: 5100
Unit Type and Rh: 5100
Unit Type and Rh: 5100
Unit Type and Rh: 5100
Unit Type and Rh: 5100

## 2018-06-23 LAB — TYPE AND SCREEN
ABO/RH(D): O POS
Antibody Screen: NEGATIVE
UNIT DIVISION: 0
UNIT DIVISION: 0
UNIT DIVISION: 0
UNIT DIVISION: 0
Unit division: 0
Unit division: 0
Unit division: 0
Unit division: 0
Unit division: 0
Unit division: 0
Unit division: 0
Unit division: 0

## 2018-06-23 LAB — COOXEMETRY PANEL
Carboxyhemoglobin: 1.3 % (ref 0.5–1.5)
Methemoglobin: 1.6 % — ABNORMAL HIGH (ref 0.0–1.5)
O2 Saturation: 46.7 %
Total hemoglobin: 10.5 g/dL — ABNORMAL LOW (ref 12.0–16.0)

## 2018-06-23 LAB — COMPREHENSIVE METABOLIC PANEL
ALT: 24 U/L (ref 0–44)
AST: 34 U/L (ref 15–41)
Albumin: 2.3 g/dL — ABNORMAL LOW (ref 3.5–5.0)
Alkaline Phosphatase: 49 U/L (ref 38–126)
Anion gap: 7 (ref 5–15)
BUN: 12 mg/dL (ref 6–20)
CO2: 26 mmol/L (ref 22–32)
Calcium: 7.6 mg/dL — ABNORMAL LOW (ref 8.9–10.3)
Chloride: 100 mmol/L (ref 98–111)
Creatinine, Ser: 0.75 mg/dL (ref 0.61–1.24)
GFR calc Af Amer: >60 mL/min (ref >60)
GFR calc non Af Amer: >60 mL/min (ref >60)
Glucose, Bld: 113 mg/dL — ABNORMAL HIGH (ref 70–99)
Potassium: 3.2 mmol/L — ABNORMAL LOW (ref 3.5–5.1)
Sodium: 133 mmol/L — ABNORMAL LOW (ref 135–145)
Total Bilirubin: 0.8 mg/dL (ref 0.3–1.2)
Total Protein: 5.2 g/dL — ABNORMAL LOW (ref 6.5–8.1)

## 2018-06-23 LAB — GLUCOSE, CAPILLARY
Glucose-Capillary: 100 mg/dL — ABNORMAL HIGH (ref 70–99)
Glucose-Capillary: 102 mg/dL — ABNORMAL HIGH (ref 70–99)
Glucose-Capillary: 166 mg/dL — ABNORMAL HIGH (ref 70–99)
Glucose-Capillary: 82 mg/dL (ref 70–99)
Glucose-Capillary: 97 mg/dL (ref 70–99)

## 2018-06-23 LAB — CBC
HCT: 31.8 % — ABNORMAL LOW (ref 39.0–52.0)
Hemoglobin: 10.3 g/dL — ABNORMAL LOW (ref 13.0–17.0)
MCH: 30.3 pg (ref 26.0–34.0)
MCHC: 32.4 g/dL (ref 30.0–36.0)
MCV: 93.5 fL (ref 78.0–100.0)
Platelets: 93 10*3/uL — ABNORMAL LOW (ref 150–400)
RBC: 3.4 MIL/uL — ABNORMAL LOW (ref 4.22–5.81)
RDW: 13.9 % (ref 11.5–15.5)
WBC: 17.1 10*3/uL — ABNORMAL HIGH (ref 4.0–10.5)

## 2018-06-23 MED ORDER — CHLORHEXIDINE GLUCONATE 0.12 % MT SOLN
OROMUCOSAL | Status: AC
  Filled 2018-06-23: qty 15

## 2018-06-23 MED ORDER — POTASSIUM CHLORIDE CRYS ER 20 MEQ PO TBCR
20.0000 meq | EXTENDED_RELEASE_TABLET | ORAL | Status: DC | PRN
  Administered 2018-06-23 (×2): 20 meq via ORAL
  Filled 2018-06-23 (×2): qty 1

## 2018-06-23 MED ORDER — FUROSEMIDE 10 MG/ML IJ SOLN
40.0000 mg | Freq: Once | INTRAMUSCULAR | Status: AC
  Administered 2018-06-23: 40 mg via INTRAVENOUS
  Filled 2018-06-23: qty 4

## 2018-06-23 MED ORDER — POTASSIUM CHLORIDE CRYS ER 20 MEQ PO TBCR
40.0000 meq | EXTENDED_RELEASE_TABLET | Freq: Two times a day (BID) | ORAL | Status: AC
  Administered 2018-06-23 (×2): 40 meq via ORAL
  Filled 2018-06-23 (×2): qty 2

## 2018-06-23 NOTE — Progress Notes (Signed)
Anesthesiology Follow-up:  Awake and alert, sitting up in bed, walking in halls with assistance.   VS: T- 37 BP- 121/78 HR- 97 (SR) RR- 20 O2 Sat 97% on high flow nasal cannula   K- 3.2 Na- 133 BUN/Cr- 12/0.75 glucose- 113 H/H- 10.3/31.8 Platelets- 63,25  57 year old male 3 days S/P repair of Type I aortic dissection and CABG X 1. Heavy smoking hx. Requiring high flow O2 by nasal cannula and PO hydralazine and metoprolol for BP control. Undergoing diuresis, stable post-op course.  Kipp Brood

## 2018-06-23 NOTE — Progress Notes (Signed)
4 Days Post-Op Procedure(s) (LRB): REPAIR OF ACUTE TYPE I ASCENDING AORTIC DISSECTION. REPLACEMENT OF AORTA (HEMI ARCH) WITH  STRAIGHT HEMASHIELD PLATINUM GRAFT. RESUSPENSION OF AORTIC VALVE. HYPOTHERMIC CIRCULATORY ARREST WITH ANTEGRADE CEREBRAL PERFUSION. (N/A) INTRAOPERATIVE TRANSESOPHAGEAL ECHOCARDIOGRAM CORONARY ARTERY BYPASS GRAFTING (CABG) x1 WITH ENDOSCOPIC VEIN HARVEST (RIGHT THIGH). SVG TO THE RIGHT CORONARY ARTERY. (N/A) Subjective:  No complaints  Objective: Vital signs in last 24 hours: Temp:  [98.2 F (36.8 C)-99 F (37.2 C)] 98.6 F (37 C) (10/05 0835) Pulse Rate:  [84-101] 90 (10/05 0700) Cardiac Rhythm: Sinus tachycardia (10/05 0300) Resp:  [14-28] 17 (10/05 0700) BP: (103-142)/(64-87) 132/87 (10/05 0700) SpO2:  [94 %-100 %] 99 % (10/05 0749) FiO2 (%):  [40 %-60 %] 40 % (10/05 0345) Weight:  [112.5 kg] 112.5 kg (10/05 0500)  Hemodynamic parameters for last 24 hours: CVP:  [12 mmHg-13 mmHg] 12 mmHg  Intake/Output from previous day: 10/04 0701 - 10/05 0700 In: 1525.5 [P.O.:1200; I.V.:325.5] Out: 2825 [Urine:2795; Chest Tube:30] Intake/Output this shift: No intake/output data recorded.  General appearance: alert and cooperative Neurologic: intact Heart: regular rate and rhythm, S1, S2 normal, no murmur, click, rub or gallop Lungs: clear to auscultation bilaterally Abdomen: soft, non-tender; bowel sounds normal; no masses,  no organomegaly Extremities: edema mild Wound: incisions ok  Lab Results: Recent Labs    06/22/18 0525 06/23/18 0429  WBC 20.1* 17.1*  HGB 10.2* 10.3*  HCT 31.3* 31.8*  PLT 75* 93*   BMET:  Recent Labs    06/22/18 0525 06/23/18 0429  NA 136 133*  K 3.9 3.2*  CL 101 100  CO2 29 26  GLUCOSE 115* 113*  BUN 12 12  CREATININE 0.93 0.75  CALCIUM 7.7* 7.6*    PT/INR: No results for input(s): LABPROT, INR in the last 72 hours. ABG    Component Value Date/Time   PHART 7.438 06/20/2018 2104   HCO3 25.8 06/20/2018 2104   TCO2 23 06/21/2018 1730   ACIDBASEDEF 1.0 06/20/2018 0840   O2SAT 46.7 06/23/2018 0458   CBG (last 3)  Recent Labs    06/22/18 2029 06/22/18 2357 06/23/18 0834  GLUCAP 102* 100* 166*   CXR: vascular congestion improved.   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 REPLACEMENT OF AORTA (HEMI ARCH) WITH  STRAIGHT HEMASHIELD PLATINUM GRAFT. RESUSPENSION OF AORTIC VALVE. HYPOTHERMIC CIRCULATORY ARREST WITH ANTEGRADE CEREBRAL PERFUSION. (N/A) INTRAOPERATIVE TRANSESOPHAGEAL ECHOCARDIOGRAM CORONARY ARTERY BYPASS GRAFTING (CABG) x1 WITH ENDOSCOPIC VEIN HARVEST (RIGHT THIGH). SVG TO THE RIGHT CORONARY ARTERY. (N/A)  POD 4  Hemodynamically stable in sinus rhythm. Continue antihypertensives.  Oxygen requirement: decreasing. Continue diuresis, IS, flutter.  Volume excess: diuresing well. Wt is about 15 lbs over baseline of 230. Continue low dose dopamine to help with diuresis.  Begin ambulating as tolerated.    LOS: 3 days    Timothy West 06/23/2018

## 2018-06-23 NOTE — Progress Notes (Signed)
Patient ID: Timothy West, male   DOB: December 22, 1960, 57 y.o.   MRN: [id number] TCTS Evening Rounds:  Hemodynamically stable in sinus rhythm 112   Dop at 2 mcg. Good diuresis today. Oxygen down to 8L. Up in chair.

## 2018-06-24 LAB — BASIC METABOLIC PANEL
Anion gap: 7 (ref 5–15)
BUN: 10 mg/dL (ref 6–20)
CO2: 25 mmol/L (ref 22–32)
Calcium: 7.5 mg/dL — ABNORMAL LOW (ref 8.9–10.3)
Chloride: 105 mmol/L (ref 98–111)
Creatinine, Ser: 0.75 mg/dL (ref 0.61–1.24)
GFR calc Af Amer: >60 mL/min (ref >60)
GFR calc non Af Amer: >60 mL/min (ref >60)
Glucose, Bld: 111 mg/dL — ABNORMAL HIGH (ref 70–99)
Potassium: 3.3 mmol/L — ABNORMAL LOW (ref 3.5–5.1)
Sodium: 137 mmol/L (ref 135–145)

## 2018-06-24 LAB — COOXEMETRY PANEL
Carboxyhemoglobin: 1.5 % (ref 0.5–1.5)
Methemoglobin: 1.7 % — ABNORMAL HIGH (ref 0.0–1.5)
O2 Saturation: 53.2 %
Total hemoglobin: 11.2 g/dL — ABNORMAL LOW (ref 12.0–16.0)

## 2018-06-24 LAB — GLUCOSE, CAPILLARY
Glucose-Capillary: 105 mg/dL — ABNORMAL HIGH (ref 70–99)
Glucose-Capillary: 72 mg/dL (ref 70–99)

## 2018-06-24 MED ORDER — POTASSIUM CHLORIDE CRYS ER 20 MEQ PO TBCR
20.0000 meq | EXTENDED_RELEASE_TABLET | Freq: Three times a day (TID) | ORAL | Status: AC
  Administered 2018-06-24 (×3): 20 meq via ORAL
  Filled 2018-06-24 (×3): qty 1

## 2018-06-24 MED ORDER — METOPROLOL TARTRATE 50 MG PO TABS
50.0000 mg | ORAL_TABLET | Freq: Two times a day (BID) | ORAL | Status: DC
  Administered 2018-06-24 – 2018-06-25 (×2): 50 mg via ORAL
  Filled 2018-06-24 (×2): qty 1

## 2018-06-24 MED ORDER — ACETAMINOPHEN 325 MG PO TABS
650.0000 mg | ORAL_TABLET | Freq: Four times a day (QID) | ORAL | Status: DC | PRN
  Administered 2018-06-24: 650 mg via ORAL
  Filled 2018-06-24: qty 2

## 2018-06-24 MED ORDER — FUROSEMIDE 40 MG PO TABS
40.0000 mg | ORAL_TABLET | Freq: Every day | ORAL | Status: AC
  Administered 2018-06-25 – 2018-06-27 (×3): 40 mg via ORAL
  Filled 2018-06-24 (×4): qty 1

## 2018-06-24 MED ORDER — SORBITOL 70 % SOLN
30.0000 mL | Freq: Once | Status: AC
  Administered 2018-06-24: 30 mL via ORAL
  Filled 2018-06-24: qty 30

## 2018-06-24 MED ORDER — LISINOPRIL 20 MG PO TABS
20.0000 mg | ORAL_TABLET | Freq: Every day | ORAL | Status: DC
  Administered 2018-06-24 – 2018-06-26 (×3): 20 mg via ORAL
  Filled 2018-06-24 (×3): qty 1

## 2018-06-24 MED ORDER — POTASSIUM CHLORIDE CRYS ER 20 MEQ PO TBCR
40.0000 meq | EXTENDED_RELEASE_TABLET | Freq: Every day | ORAL | Status: AC
  Administered 2018-06-25 – 2018-06-27 (×3): 40 meq via ORAL
  Filled 2018-06-24 (×3): qty 2

## 2018-06-24 MED ORDER — POTASSIUM CHLORIDE 10 MEQ/50ML IV SOLN
10.0000 meq | INTRAVENOUS | Status: AC
  Administered 2018-06-24 (×3): 10 meq via INTRAVENOUS
  Filled 2018-06-24 (×3): qty 50

## 2018-06-24 MED ORDER — METOPROLOL TARTRATE 25 MG/10 ML ORAL SUSPENSION: 50.0000 mg | Freq: Two times a day (BID) | ORAL | Status: DC

## 2018-06-24 NOTE — Plan of Care (Signed)
  Problem: Education: Goal: Knowledge of General Education information will improve Description Including pain rating scale, medication(s)/side effects and non-pharmacologic comfort measures Outcome: Progressing   Problem: Health Behavior/Discharge Planning: Goal: Ability to manage health-related needs will improve Outcome: Progressing   Problem: Clinical Measurements: Goal: Ability to maintain clinical measurements within normal limits will improve Outcome: Progressing Goal: Will remain free from infection Outcome: Progressing Goal: Diagnostic test results will improve Outcome: Progressing Goal: Respiratory complications will improve Outcome: Progressing Goal: Cardiovascular complication will be avoided Outcome: Progressing   Problem: Activity: Goal: Risk for activity intolerance will decrease Outcome: Progressing   Problem: Nutrition: Goal: Adequate nutrition will be maintained Outcome: Progressing   Problem: Coping: Goal: Level of anxiety will decrease Outcome: Progressing   Problem: Elimination: Goal: Will not experience complications related to bowel motility Outcome: Progressing Goal: Will not experience complications related to urinary retention Outcome: Progressing   Problem: Pain Managment: Goal: General experience of comfort will improve Outcome: Progressing   Problem: Safety: Goal: Ability to remain free from injury will improve Outcome: Progressing   Problem: Skin Integrity: Goal: Risk for impaired skin integrity will decrease Outcome: Progressing   Problem: Education: Goal: Will demonstrate proper wound care and an understanding of methods to prevent future damage Outcome: Progressing Goal: Knowledge of disease or condition will improve Outcome: Progressing Goal: Knowledge of the prescribed therapeutic regimen will improve Outcome: Progressing Goal: Individualized Educational Video(s) Outcome: Progressing   Problem: Activity: Goal: Risk for  activity intolerance will decrease Outcome: Progressing   Problem: Cardiac: Goal: Will achieve and/or maintain hemodynamic stability Outcome: Progressing   Problem: Clinical Measurements: Goal: Postoperative complications will be avoided or minimized Outcome: Progressing   Problem: Respiratory: Goal: Respiratory status will improve Outcome: Progressing   Problem: Skin Integrity: Goal: Wound healing without signs and symptoms of infection Outcome: Progressing Goal: Risk for impaired skin integrity will decrease Outcome: Progressing   Problem: Urinary Elimination: Goal: Ability to achieve and maintain adequate renal perfusion and functioning will improve Outcome: Progressing   Problem: Activity: Goal: Ability to tolerate increased activity will improve Outcome: Progressing   Problem: Respiratory: Goal: Ability to maintain a clear airway and adequate ventilation will improve Outcome: Progressing   Problem: Role Relationship: Goal: Method of communication will improve Outcome: Progressing   Problem: Education: Goal: Will demonstrate proper wound care and an understanding of methods to prevent future damage Outcome: Progressing Goal: Knowledge of disease or condition will improve Outcome: Progressing Goal: Knowledge of the prescribed therapeutic regimen will improve Outcome: Progressing Goal: Individualized Educational Video(s) Outcome: Progressing   Problem: Activity: Goal: Risk for activity intolerance will decrease Outcome: Progressing   Problem: Cardiac: Goal: Will achieve and/or maintain hemodynamic stability Outcome: Progressing   Problem: Clinical Measurements: Goal: Postoperative complications will be avoided or minimized Outcome: Progressing   Problem: Respiratory: Goal: Respiratory status will improve Outcome: Progressing   Problem: Skin Integrity: Goal: Wound healing without signs and symptoms of infection Outcome: Progressing Goal: Risk for  impaired skin integrity will decrease Outcome: Progressing   Problem: Urinary Elimination: Goal: Ability to achieve and maintain adequate renal perfusion and functioning will improve Outcome: Progressing

## 2018-06-24 NOTE — Progress Notes (Signed)
5 Days Post-Op Procedure(s) (LRB): REPAIR OF ACUTE TYPE I ASCENDING AORTIC DISSECTION. REPLACEMENT OF AORTA (HEMI ARCH) WITH  STRAIGHT HEMASHIELD PLATINUM GRAFT. RESUSPENSION OF AORTIC VALVE. HYPOTHERMIC CIRCULATORY ARREST WITH ANTEGRADE CEREBRAL PERFUSION. (N/A) INTRAOPERATIVE TRANSESOPHAGEAL ECHOCARDIOGRAM CORONARY ARTERY BYPASS GRAFTING (CABG) x1 WITH ENDOSCOPIC VEIN HARVEST (RIGHT THIGH). SVG TO THE RIGHT CORONARY ARTERY. (N/A) Subjective: No complaints, ambulating fairly well. No BM yet  Objective: Vital signs in last 24 hours: Temp:  [98.2 F (36.8 C)-98.9 F (37.2 C)] 98.4 F (36.9 C) (10/06 0833) Pulse Rate:  [87-116] 109 (10/06 1000) Cardiac Rhythm: Sinus tachycardia (10/06 0830) Resp:  [16-30] 25 (10/06 1000) BP: (106-148)/(50-113) 120/83 (10/06 1000) SpO2:  [92 %-100 %] 95 % (10/06 1000) Weight:  [108.5 kg] 108.5 kg (10/06 0600)  Hemodynamic parameters for last 24 hours:    Intake/Output from previous day: 10/05 0701 - 10/06 0700 In: 568.4 [P.O.:490; I.V.:78.4] Out: 4750 [Urine:4750] Intake/Output this shift: Total I/O In: 336.6 [P.O.:240; I.V.:13.7; IV Piggyback:82.9] Out: -   General appearance: alert and cooperative Neurologic: intact Heart: regular rate and rhythm, S1, S2 normal, no murmur, click, rub or gallop Lungs: clear to auscultation bilaterally Extremities: edema mild Wound: incision ok  Lab Results: Recent Labs    06/22/18 0525 06/23/18 0429  WBC 20.1* 17.1*  HGB 10.2* 10.3*  HCT 31.3* 31.8*  PLT 75* 93*   BMET:  Recent Labs    06/23/18 0429 06/24/18 0349  NA 133* 137  K 3.2* 3.3*  CL 100 105  CO2 26 25  GLUCOSE 113* 111*  BUN 12 10  CREATININE 0.75 0.75  CALCIUM 7.6* 7.5*    PT/INR: No results for input(s): LABPROT, INR in the last 72 hours. ABG    Component Value Date/Time   PHART 7.438 06/20/2018 2104   HCO3 25.8 06/20/2018 2104   TCO2 23 06/21/2018 1730   ACIDBASEDEF 1.0 06/20/2018 0840   O2SAT 53.2 06/24/2018 0405    CBG (last 3)  Recent Labs    06/23/18 1116 06/23/18 2335 06/24/18 0336  GLUCAP 82 97 105*    Assessment/Plan: S/P Procedure(s) (LRB): REPAIR OF ACUTE TYPE I ASCENDING AORTIC DISSECTION. REPLACEMENT OF AORTA (HEMI ARCH) WITH  STRAIGHT HEMASHIELD PLATINUM GRAFT. RESUSPENSION OF AORTIC VALVE. HYPOTHERMIC CIRCULATORY ARREST WITH ANTEGRADE CEREBRAL PERFUSION. (N/A) INTRAOPERATIVE TRANSESOPHAGEAL ECHOCARDIOGRAM CORONARY ARTERY BYPASS GRAFTING (CABG) x1 WITH ENDOSCOPIC VEIN HARVEST (RIGHT THIGH). SVG TO THE RIGHT CORONARY ARTERY. (N/A)  POD 5 Hemodynamically stable. Will increase Lopressor to 50 bid and add lisinopril 20 mg daily. DC hydralazine since not ideal with dissection and he is not going to take a medication 4X per day at home. DC dopamine.  He has diuresed well and oxygenation much better. On 1L now. Continue IS and ambulation. Weight is about 9 lbs over his reported baseline. Will continue gentle diuresis with oral lasix. Replace K+.    LOS: 4 days    Timothy West 06/24/2018

## 2018-06-24 NOTE — Progress Notes (Signed)
Patient ID: Timothy West, male   DOB: Nov 21, 1960, 57 y.o.   MRN: [id number] TCTS evening rounds:  Hemodynamically stable off dopamine. Ambulating  sats 94 on RA.

## 2018-06-25 DIAGNOSIS — Z720 Tobacco use: Secondary | ICD-10-CM

## 2018-06-25 DIAGNOSIS — I7100 Dissection of unspecified site of aorta: Secondary | ICD-10-CM

## 2018-06-25 DIAGNOSIS — I10 Essential (primary) hypertension: Secondary | ICD-10-CM

## 2018-06-25 LAB — BASIC METABOLIC PANEL
Anion gap: 7 (ref 5–15)
BUN: 9 mg/dL (ref 6–20)
CO2: 23 mmol/L (ref 22–32)
Calcium: 7.6 mg/dL — ABNORMAL LOW (ref 8.9–10.3)
Chloride: 105 mmol/L (ref 98–111)
Creatinine, Ser: 0.72 mg/dL (ref 0.61–1.24)
GFR calc Af Amer: >60 mL/min (ref >60)
GFR calc non Af Amer: >60 mL/min (ref >60)
Glucose, Bld: 111 mg/dL — ABNORMAL HIGH (ref 70–99)
Potassium: 3.5 mmol/L (ref 3.5–5.1)
Sodium: 135 mmol/L (ref 135–145)

## 2018-06-25 LAB — COOXEMETRY PANEL
Carboxyhemoglobin: 1.8 % — ABNORMAL HIGH (ref 0.5–1.5)
Methemoglobin: 1.6 % — ABNORMAL HIGH (ref 0.0–1.5)
O2 Saturation: 59.6 %
Total hemoglobin: 10.3 g/dL — ABNORMAL LOW (ref 12.0–16.0)

## 2018-06-25 LAB — GLUCOSE, CAPILLARY
Glucose-Capillary: 99 mg/dL (ref 70–99)
Glucose-Capillary: 130 mg/dL — ABNORMAL HIGH (ref 70–99)

## 2018-06-25 LAB — HEMOGLOBIN A1C
Hgb A1c MFr Bld: 4.9 % (ref 4.8–5.6)
MEAN PLASMA GLUCOSE: 93.93 mg/dL

## 2018-06-25 MED ORDER — ATORVASTATIN CALCIUM 40 MG PO TABS
40.0000 mg | ORAL_TABLET | Freq: Every day | ORAL | Status: DC
  Administered 2018-06-25 – 2018-06-27 (×3): 40 mg via ORAL
  Filled 2018-06-25 (×3): qty 1

## 2018-06-25 MED ORDER — POTASSIUM CHLORIDE 10 MEQ/50ML IV SOLN
10.0000 meq | INTRAVENOUS | Status: AC
  Administered 2018-06-25 (×3): 10 meq via INTRAVENOUS
  Filled 2018-06-25 (×3): qty 50

## 2018-06-25 MED ORDER — SODIUM CHLORIDE 0.9 % IV SOLN
INTRAVENOUS | Status: DC | PRN
  Administered 2018-06-25: 250 mL via INTRAVENOUS

## 2018-06-25 MED ORDER — PNEUMOCOCCAL 13-VAL CONJ VACC IM SUSP: 0.5000 mL | INTRAMUSCULAR | Status: DC | PRN

## 2018-06-25 MED ORDER — METOPROLOL TARTRATE 50 MG PO TABS
75.0000 mg | ORAL_TABLET | Freq: Two times a day (BID) | ORAL | Status: DC
  Administered 2018-06-25 – 2018-06-26 (×2): 75 mg via ORAL
  Filled 2018-06-25 (×2): qty 1

## 2018-06-25 MED ORDER — INFLUENZA VAC SPLIT QUAD 0.5 ML IM SUSY: 0.5000 mL | PREFILLED_SYRINGE | INTRAMUSCULAR | Status: DC | PRN

## 2018-06-25 MED FILL — Heparin Sodium (Porcine) Inj 1000 Unit/ML: INTRAMUSCULAR | Qty: 30 | Status: AC

## 2018-06-25 MED FILL — Potassium Chloride Inj 2 mEq/ML: INTRAVENOUS | Qty: 40 | Status: AC

## 2018-06-25 MED FILL — Magnesium Sulfate Inj 50%: INTRAMUSCULAR | Qty: 20 | Status: AC

## 2018-06-25 NOTE — Consult Note (Addendum)
Cardiology Consult    Patient ID: Timothy West MRN: [id number], DOB/AGE: 1961-07-12   Admit date: 06/19/2018 Date of Consult: 06/25/2018  Primary Physician: No PCP listed in System Primary Cardiologist: New Consult Requesting Provider: Grace Isaac, MD  Patient Profile    Timothy West is a 57 y.o. male with a hypertension, aortic dissection s/p repair which included CAVG x1 on 06/19/2018, and a 60+ pack year smoking history, who is being seen today to establish care and to assist in management of hypertension at the request of Dr. Servando Snare.  History of Present Illness    Timothy West is a 57 year old Caucasian male with a 60+ pack year smoking history and no known cardiac history prior to this hospitalization, who presented to the [facility] ED on 06/19/2018 via EMS with sudden onset of severe chest pain and bilateral leg weakness/numbness. Patient states onset of pain occurred while resting on his cough. He got up to try to walk and noticed that his legs were weak/numb. He thought he was having a heart attack so he called 911. He notes some sweating and nausea while waiting on the EMS.  Upon arrival to the ED, he was noted to be hypotensive and mottled in color with weakness and thready pulses in lower extremities. There was concern for aortic dissection. Patient was immediately taken to CT scanner which showed an acute type A thoracoabdominal aortic dissection with acute mediastinal hematoma and active mediastinal bleeding along the medial ascending thoracic aortic contour. Patient was taken emergently to the OR with Dr. Servando Snare for repair and CABGx1. There were no major complications during surgery and patient was hemodynamically stable when he was transferred to ICU. There have been no post-operative complications. Patient gradually weaned off Mirinone and Dopamine. Cardiology consulted today to establish care and assist with management of hypertension.   Patient notes some  substernal chest pain from his open heart surgery but states this pain feels different than the pain from the pain prior to presenting to the ED. He states his pain is well controlled at this time. He also notes some mild shortness of breath. He denies any palpitations, lightheadedness, or dizziness.   Patient states he has no insurance and rarely goes to the doctor which is why he did not know he had hypertension. He denies any exertional chest pain or dyspnea in the past. About 5-6 years ago, he did note some "fluttering" in his chest. He ended up going to [facility]. Patient states he had a stress test at that time and it came back normal (records unavailable at this time). 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   Past Medical History   Past Medical History:  Diagnosis Date  . Coronary artery disease   . Hypertension     Past Surgical History:  Procedure Laterality Date  . CORONARY ARTERY BYPASS GRAFT N/A 06/19/2018   Procedure: CORONARY ARTERY BYPASS GRAFTING (CABG) x1 WITH ENDOSCOPIC VEIN HARVEST (RIGHT THIGH). SVG TO THE RIGHT CORONARY ARTERY.;  Surgeon: Grace Isaac, MD;  Location: [place];  Service: Open Heart Surgery;  Laterality: N/A;  . INTRAOPERATIVE TRANSESOPHAGEAL ECHOCARDIOGRAM  06/19/2018   Procedure: INTRAOPERATIVE TRANSESOPHAGEAL ECHOCARDIOGRAM;  Surgeon: Grace Isaac, MD;  Location: [place] OR;  Service: Open Heart Surgery;;  . REPLACEMENT ASCENDING AORTA N/A 06/19/2018   Procedure: REPAIR OF ACUTE TYPE I ASCENDING AORTIC DISSECTION. REPLACEMENT OF AORTA (HEMI ARCH) WITH  32MM STRAIGHT HEMASHIELD PLATINUM GRAFT. RESUSPENSION OF AORTIC VALVE. HYPOTHERMIC CIRCULATORY ARREST WITH ANTEGRADE CEREBRAL PERFUSION.;  Surgeon: Grace Isaac, MD;  Location: [place];  Service: Open Heart Surgery;  Laterality: N/A;     Allergies  No Known Allergies  Inpatient Medications    . aspirin EC  81 mg Oral Daily     Or  . aspirin  81 mg Per Tube Daily  . bisacodyl  10 mg Oral Daily   Or  . bisacodyl  10 mg Rectal Daily  . chlorhexidine gluconate (MEDLINE KIT)  15 mL Mouth Rinse BID  . Chlorhexidine Gluconate Cloth  6 each Topical Daily  . docusate sodium  200 mg Oral Daily  . furosemide  40 mg Oral Daily  . guaiFENesin  600 mg Oral BID  . levalbuterol  0.63 mg Nebulization TID  . lisinopril  20 mg Oral Daily  . metoprolol tartrate  50 mg Oral BID   Or  . metoprolol tartrate  50 mg Per Tube BID  . pantoprazole  40 mg Oral Daily  . potassium chloride  40 mEq Oral Daily  . sodium chloride flush  10-40 mL Intracatheter Q12H  . sodium chloride flush  3 mL Intravenous Q12H    Family History    History reviewed. No pertinent family history. has no family status information on file.    Social History    Social History   Socioeconomic History  . Marital status: Single    Spouse name: Not on file  . Number of children: Not on file  . Years of education: Not on file  . Highest education level: Not on file  Occupational History  . Not on file  Social Needs  . Financial resource strain: Not on file  . Food insecurity:    Worry: Not on file    Inability: Not on file  . Transportation needs:    Medical: Not on file    Non-medical: Not on file  Tobacco Use  . Smoking status: Current Every Day Smoker  . Smokeless tobacco: Never Used  Substance and Sexual Activity  . Alcohol use: Yes  . Drug use: Never  . Sexual activity: Yes  Lifestyle  . Physical activity:    Days per week: Not on file    Minutes per session: Not on file  . Stress: Not on file  Relationships  . Social connections:    Talks on phone: Not on file    Gets together: Not on file    Attends religious service: Not on file    Active member of club or organization: Not on file    Attends meetings of clubs or organizations: Not on file    Relationship status: Not on file  . Intimate partner violence:    Fear of  current or ex partner: Not on file    Emotionally abused: Not on file    Physically abused: Not on file    Forced sexual activity: Not on file  Other Topics Concern  . Not on file  Social History Narrative  . Not on file     Review of Systems    Review of Systems  Constitutional: Positive for diaphoresis. Negative for chills, fever and malaise/fatigue.  Respiratory: Positive for cough, sputum production and shortness of breath. Negative for hemoptysis.   Cardiovascular: Positive for chest pain. Negative for palpitations, orthopnea, claudication, leg swelling and PND.  Gastrointestinal: Positive for nausea. Negative for abdominal pain, blood in stool, melena and vomiting.  Genitourinary: Negative for hematuria.  Neurological: Positive for focal weakness. Negative for dizziness, loss of consciousness and headaches.  Endo/Heme/Allergies: Does not bruise/bleed easily.  Psychiatric/Behavioral: Positive for substance abuse (tobacco use).    Physical Exam    Blood pressure 139/78, pulse 100, temperature 98.5 F (36.9 C), temperature source Oral, resp. rate (!) 23, height '6\' 2"'$  (1.88 m), weight 107.7 kg, SpO2 95 %.  General: 57 y.o. obese Caucasian male resting comfortably in no acute distress. Pleasant and cooperative. Currently on 2 L of supplemental O2 via nasal cannula.  HEENT: Normal  Neck: Supple. No carotid bruits appreciated. Difficult to assess JVD due to gauze following central line removal. Lungs: Mildly tachypneic. Rhonchi noted bilaterally. No wheezing or rales appreciated.  Heart: Tachycardic with regular rhythm. Distinct S1 and S2. No murmurs, gallops, or rubs.  Abdomen: Abdomen soft, obese, and non-tender to palpation. Bowel sounds present. Extremities: Bilateral lower extremities cool to the touch. No significant lower extremity edema. Radial pulses 2+ and equal bilaterally. Distal pedal pulses 1+ and equal bilaterally. SCDs on both legs.  Neuro: Alert and oriented x3. No  focal deficits. Moves all extremities spontaneously. Psych: Normal affect.  Labs    Troponin (Point of Care Test) No results for input(s): TROPIPOC in the last 72 hours. No results for input(s): CKTOTAL, CKMB, TROPONINI in the last 72 hours. Lab Results  Component Value Date   WBC 17.1 (H) 06/23/2018   HGB 10.3 (L) 06/23/2018   HCT 31.8 (L) 06/23/2018   MCV 93.5 06/23/2018   PLT 93 (L) 06/23/2018    Recent Labs  Lab 06/23/18 0429  06/25/18 0351  NA 133*   < > 135  K 3.2*   < > 3.5  CL 100   < > 105  CO2 26   < > 23  BUN 12   < > 9  CREATININE 0.75   < > 0.72  CALCIUM 7.6*   < > 7.6*  PROT 5.2*  --   --   BILITOT 0.8  --   --   ALKPHOS 49  --   --   ALT 24  --   --   AST 34  --   --   GLUCOSE 113*   < > 111*   < > = values in this interval not displayed.   No results found for: CHOL, HDL, LDLCALC, TRIG No results found for: [facility]   Radiology Studies    Dg Chest Port 1 View  Result Date: 06/23/2018 CLINICAL DATA:  Chest tube in place EXAM: PORTABLE CHEST 1 VIEW COMPARISON:  06/22/2018 FINDINGS: Interval removal of right central line. Prior CABG. Interval placement of left PICC line with the tip in the SVC. Cardiomegaly with vascular congestion and bibasilar atelectasis. Possible small bilateral effusions. IMPRESSION: Cardiomegaly with vascular congestion and bibasilar atelectasis. Small effusions. Left PICC line tip in the SVC. Electronically Signed   By: Rolm Baptise M.D.   On: 06/23/2018 07:58   Dg Chest Port 1 View  Result Date: 06/22/2018 CLINICAL DATA:  Chest tube placement following CABG. EXAM: PORTABLE CHEST 1 VIEW COMPARISON:  06/21/2018 FINDINGS: Swan-Ganz catheter has been removed. Right internal jugular approach catheter sheath remain. Mediastinal/chest drains in place. Enlarged cardiac silhouette. Interstitial pulmonary edema with failing opacities likely representing layering pleural effusions. Bilateral lower lobe atelectasis versus airspace consolidation.  Osseous structures are without acute abnormality. Soft tissues are grossly normal. IMPRESSION: Interstitial pulmonary edema with probable bilateral pleural effusions. Bilateral lower lobe atelectasis versus airspace  consolidation. Support apparatus as described. Electronically Signed   By: Fidela Salisbury M.D.   On: 06/22/2018 08:21   Dg Chest Port 1 View  Result Date: 06/21/2018 CLINICAL DATA:  Status post CABG. EXAM: PORTABLE CHEST 1 VIEW COMPARISON:  06/20/2018 FINDINGS: Interval removal of ET tube and nasogastric tube. There are 2 right internal jugular catheters. The first catheter terminates over the expected location of the cavoatrial junction. The second catheter terminates over the right ventricular outflow tract. Mediastinal drains again noted. Diminished lung volumes with mild to moderate pulmonary edema. Increased from previous exam. Small left effusion. IMPRESSION: 1. Mild CHF. 2. Support apparatus positioned as above. Electronically Signed   By: Kerby Moors M.D.   On: 06/21/2018 08:35   Dg Chest Port 1 View  Result Date: 06/20/2018 CLINICAL DATA:  Status post ascending aortic replacement and CABG. EXAM: PORTABLE CHEST 1 VIEW COMPARISON:  06/19/2018 FINDINGS: Sequelae of interval CABG and ascending aortic replacement are identified. Endotracheal tube terminates at the clavicular heads. A right jugular catheter terminates over the mid SVC. A right jugular Swan-Ganz catheter terminates over the main pulmonary outflow tract. An enteric tube terminates in the left upper abdomen with tip at the level of the gastric fundus. Mediastinal drains are present. The mediastinum remains widened with hematoma shown on CT. Lung volumes are lower than on the prior study, and there is mild central pulmonary vascular congestion and mild atelectasis bilaterally. No sizable pleural effusion or pneumothorax is identified. IMPRESSION: 1. Support devices as above. 2. Lower lung volumes with mild central  pulmonary vascular congestion and bilateral atelectasis. Electronically Signed   By: Logan Bores M.D.   On: 06/20/2018 09:02   Dg Chest Portable 1 View  Result Date: 06/19/2018 CLINICAL DATA:  New onset chest pain EXAM: PORTABLE CHEST 1 VIEW COMPARISON:  None. FINDINGS: No focal opacity or pleural effusion. Heart size is normal. Bilateral hilar enlargement. No pneumothorax. Micro nodularity in the lower lung zones. IMPRESSION: 1. No acute pulmonary infiltrate 2. Bilateral hilar enlargement, question nodes. Recommend contrast-enhanced CT to further evaluate 3. Suspicion of multiple punctate nodules within the lung bases, could also be evaluated at chest CT. Electronically Signed   By: Donavan Foil M.D.   On: 06/19/2018 20:38   Ct Angio Chest/abd/pel For Dissection W And/or W/wo  Result Date: 06/19/2018 CLINICAL DATA:  Acute onset chest pain concern for dissection EXAM: CT ANGIOGRAPHY CHEST, ABDOMEN AND PELVIS TECHNIQUE: Multidetector CT imaging through the chest, abdomen and pelvis was performed using the standard protocol during bolus administration of intravenous contrast. Multiplanar reconstructed images and MIPs were obtained and reviewed to evaluate the vascular anatomy. CONTRAST:  100 cc Isovue 370 COMPARISON:  06/19/2018 chest x-ray FINDINGS: CTA CHEST FINDINGS Cardiovascular: There is an acute type A dissection of the thoracic aorta beginning at the aortic root and involving the entire thoracic aorta and extending into the abdomen. Both true and false lumens remain patent. Three-vessel arch anatomy remaining patent off of the true lumen. Dissection extends into the right brachiocephalic artery to the bifurcation of the right subclavian and right common carotid artery. There is acute mediastinal hematoma and acute mediastinal active bleeding medially along the ascending thoracic aorta, image 63 series 6. Mediastinal hematoma compresses the main pulmonary arteries. There is also a small pericardial  effusion. Normal heart size. Mediastinum/Nodes: Thyroid, trachea and esophagus demonstrate no acute finding. Hilar adenopathy suspected bilaterally. Right hilar lymph nodes are calcified compatible with remote granulomatous disease. Lungs/Pleura: Mild pulmonary emphysema. Dependent hypoventilatory  changes. No acute airspace process, collapse or consolidation. No edema airspace disease. No pleural abnormality, effusion or pneumothorax. Musculoskeletal: Degenerative changes noted of the spine. Chronic lower thoracic mild compression deformities. No definite acute compression fracture. Sternum intact. Review of the MIP images confirms the above findings. CTA ABDOMEN AND PELVIS FINDINGS VASCULAR Aorta: Acute dissection extends into the abdominal aorta. The true lumen is the smaller lumen. Infrarenal aorta is poorly opacified related to significant compression of the true lumen by the false lumen. Dissection extends to the bifurcation. No retroperitoneal hematoma. Celiac: Remains patent including its branches off the true lumen. SMA: Remains patent including its branches off the true lumen. Renals: Right renal artery remains patent off the true lumen. Left renal artery has decreased perfusion from the false lumen. IMA: IMA has decreased perfusion from the false lumen. Inflow: There is compromise of the iliac inflow secondary to extension of the dissection to the bifurcation. There is contrast opacification within the right common, internal, external iliac arteries. Right iliac system is likely still supplied by the true lumen. Left iliac system is not opacified and likely secondary to hypoperfusion from the false lumen. Veins: Dedicated venous imaging not performed Review of the MIP images confirms the above findings. NON-VASCULAR Hepatobiliary: No focal liver abnormality is seen. No gallstones, gallbladder wall thickening, or biliary dilatation. Pancreas: Unremarkable. No pancreatic ductal dilatation or surrounding  inflammatory changes. Spleen: Normal in size without focal abnormality. Adrenals/Urinary Tract: Adrenal glands unremarkable. Right kidney hypodense cysts noted, largest measures 3 cm posteriorly. Left kidney is hypoperfused secondary to the left renal artery originate from the false lumen. Left renal cysts also suspected. No renal obstruction or hydronephrosis. No hydroureter, ureteral calculus, or definite bladder abnormality. Bladder is underdistended. Stomach/Bowel: Negative for bowel obstruction, significant dilatation, ileus, free air. No ascites or hemoperitoneum. Normal appearing appendix. Colonic diverticulosis noted distally. No acute inflammatory process, fluid collection, or abscess. Lymphatic: No adenopathy Reproductive: Unremarkable Other: Inguinal hernia repairs bilaterally. No recurrent hernia. Intact abdominal wall. Musculoskeletal: No acute osseous finding. Degenerative changes of the spine. Review of the MIP images confirms the above findings. 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 Mediastinal hematoma compresses the pulmonary outflow tract and the main pulmonary arteries bilaterally. There is associated small pericardial effusion. Hypoperfusion of the left kidney and compromised left iliac inflow secondary to extension of the dissection to the bifurcation. These results were called by telephone at the time of interpretation on 06/19/2018 at 9:15 pm to Dr. Duffy Bruce , who verbally acknowledged these results. Electronically Signed   By: Jerilynn Mages.  Shick M.D.   On: 06/19/2018 21:49   [place] Ekg Site Rite  Result Date: 06/22/2018 If Site Rite image not attached, placement could not be confirmed due to current cardiac rhythm.   EKG     EKG:  EKG from 06/19/2018 was personally reviewed and demonstrates: normal sinus rhythm, rate 83 bpm, with early repolarization in  V3 EKG from 06/20/2018 was personally reviewed and demonstrates: normal sinus rhythm, rate 97 bpm, with 37m ST elevation in V2, V3, and V4  Telemetry: Telemetry was personally reviewed and demonstrates: normal sinus rhythm/ sinus tachycardia, rate between 90s and 120bpm , with brief run of SVT of about 6 beats late last night  Cardiac Imaging    TEE 06/19/2018: Results: - Mitral valve: The mitral leaflets opened normally without restriction. There was normal coaptation of the leaflets with trace mitral insufficiency. -  Tricuspid valve: The tricuspid valve appeared structurally normal. There was trace tricuspid insufficiency. - Pulmonic valve: The pulmonic valve leaflets opened normally. There was trace pulmonic insufficiency. - Right ventricle: On the pre-bypass exam, there was normal-appearing right ventricular systolic function. The right ventricular cavity was at the upper limits of normal in size. On the post bypass exam, upon separation from cardiopulmonary bypass there was right ventricular systolic dysfunction which appeared to improve over the next 10 to 15 minutes with inotropic support. At the conclusion of the procedure, the right ventricular systolic function appeared normal.  CT Angio Chest/Abdomen/Pelvis 06/19/2018: Impression: Acute type A thoracoabdominal aortic dissection with acute mediastinal hematoma and active mediastinal bleeding along the medial ascending thoracic aortic contour. Mediastinal hematoma compresses the pulmonary outflow tract and the main pulmonary arteries bilaterally. There is associated small pericardial effusion.  Hypoperfusion of the left kidney and compromised left iliac inflow secondary to extension of the dissection to the bifurcation.  Assessment & Plan    1. Acute Type 1 Ascending Aortic Dissection s/p Repair  - Patient presented to the ED on 06/19/2018 via EMS with acute onset of severe chest pain with bilateral leg weakness. CT showed acute  type 1 ascending aortic dissection. Patient was taken to the OR emergently for repair, which included CABG x1, with Dr. Servando Snare. There have been no post-op complications. Cardiology was consulted today to establish care and assist with management of hypertension. - Currently on Metoprolol '50mg'$  twice daily and Lisinopril '20mg'$  daily. Will increase Metoprolol to '75mg'$  twice daily.  - SCcr 0.72. Continue Lasix per Cardiovascular Surgery. - Potassium 3.5. Magnesium 1.9. Replace per protocol. Continue daily BMP. - Hgb 10.3 appropriate for amount of acute blood loss.   - Pain is currently well controlled on current regimen. - Will check fasting lipid panel and Hgb A1c. - Will recheck EKG. - Continue Aspirin '81mg'$  daily. - Start Lipitor '40mg'$  daily. - Will get transthoracic echo prior to discharge, possibly tomorrow.   2. Hypertension - Most recent BP 139/78 this afternoon. HR rate has been a little high - Increase Metoprolol from '50mg'$  to '75mg'$  twice daily. - Continue Lisinopril '20mg'$  daily.   3. Tobacco Use - Patient as a 60+ pack year smoking history.  - Emphasized the importance of complete tobacco cessation.  - Patient states he is going to quit.   Signed, Darreld Mclean, PA-C 06/25/2018, 3:47 PM  For questions or updates, please contact   Please consult [email or web address] for contact info under Cardiology/STEMI.  Pt seen and examined   I agree with findings as noted above by C Sarajane Jews Pt is a 57 yo with no prior cardiac history  Never went to doctor     Presented to [facility] on 10/2 with CP   Found to have Type A aortic dissection.   NOw s/p repair with resuspension of the aortic valve and CBG x 1 (SVG to RCA)  He currently appears comfortable in bed   Some CP  Pain worse with deep inspiration  On exam:   HR 90s to 100s (SR/ST)   Neck  JVP normal   LUngs:  Rhonchi bilaterally   Cardiac exam:  RRR   No S3  Abd  No hepatomegaly   Ext wth no edema   Good pulses   WIll continue to follow  post op EKG ordered   Lipids in AM COunselled on tobacco cessation  Would get echo before discharge as baseline   INtraop TEE  LVEF was normal.  Dorris Carnes

## 2018-06-25 NOTE — Progress Notes (Signed)
[facility] BRIEF SICU PROGRESS NOTE  6 Days Post-Op  S/P Procedure(s) (LRB): REPAIR OF ACUTE TYPE I ASCENDING AORTIC DISSECTION. REPLACEMENT OF AORTA (HEMI ARCH) WITH  STRAIGHT HEMASHIELD PLATINUM GRAFT. RESUSPENSION OF AORTIC VALVE. HYPOTHERMIC CIRCULATORY ARREST WITH ANTEGRADE CEREBRAL PERFUSION. (N/A) INTRAOPERATIVE TRANSESOPHAGEAL ECHOCARDIOGRAM CORONARY ARTERY BYPASS GRAFTING (CABG) x1 WITH ENDOSCOPIC VEIN HARVEST (RIGHT THIGH). SVG TO THE RIGHT CORONARY ARTERY. (N/A)   Stable day  Plan: Continue current plan  Purcell Nails, MD 06/25/2018 6:49 PM

## 2018-06-25 NOTE — Progress Notes (Deleted)
Patient ID: Timothy West, male   DOB: August 21, 1961, 57 y.o.   MRN: [id number] EVENING ROUNDS NOTE :     [address]       [place] [address]             [phone]                 6 Days Post-Op Procedure(s) (LRB): REPAIR OF ACUTE TYPE I ASCENDING AORTIC DISSECTION. REPLACEMENT OF AORTA (HEMI ARCH) WITH  STRAIGHT HEMASHIELD PLATINUM GRAFT. RESUSPENSION OF AORTIC VALVE. HYPOTHERMIC CIRCULATORY ARREST WITH ANTEGRADE CEREBRAL PERFUSION. (N/A) INTRAOPERATIVE TRANSESOPHAGEAL ECHOCARDIOGRAM CORONARY ARTERY BYPASS GRAFTING (CABG) x1 WITH ENDOSCOPIC VEIN HARVEST (RIGHT THIGH). SVG TO THE RIGHT CORONARY ARTERY. (N/A)  Total Length of Stay:  LOS: 5 days  BP (!) 147/88   Pulse (!) 106   Temp 98.2 F (36.8 C) (Oral)   Resp (!) 26   Ht 6\' 2"  (1.88 m)   Wt 107.7 kg   SpO2 96%   BMI 30.48 kg/m   .Intake/Output      10/07 0701 - 10/08 0700   P.O. 240   I.V. (mL/kg) 49.9 (0.5)   IV Piggyback 133.4   Total Intake(mL/kg) 423.3 (3.9)   Urine (mL/kg/hr) 810 (0.6)   Stool 0   Total Output 810   Net -386.7       Stool Occurrence 1 x     . sodium chloride Stopped (06/25/18 1538)     Lab Results  Component Value Date   WBC 17.1 (H) 06/23/2018   HGB 10.3 (L) 06/23/2018   HCT 31.8 (L) 06/23/2018   PLT 93 (L) 06/23/2018   GLUCOSE 111 (H) 06/25/2018   ALT 24 06/23/2018   AST 34 06/23/2018   NA 135 06/25/2018   K 3.5 06/25/2018   CL 105 06/25/2018   CREATININE 0.72 06/25/2018   BUN 9 06/25/2018   CO2 23 06/25/2018   INR 0.95 06/20/2018   HGBA1C 4.9 06/25/2018   Stable day Respirator status improving    Delight Ovens MD  [place] [phone] Office [phone] 06/25/2018 7:04 PM

## 2018-06-25 NOTE — Progress Notes (Addendum)
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 REPLACEMENT OF AORTA (HEMI ARCH) WITH  STRAIGHT HEMASHIELD PLATINUM GRAFT. RESUSPENSION OF AORTIC VALVE. HYPOTHERMIC CIRCULATORY ARREST WITH ANTEGRADE CEREBRAL PERFUSION. (N/A) INTRAOPERATIVE TRANSESOPHAGEAL ECHOCARDIOGRAM CORONARY ARTERY BYPASS GRAFTING (CABG) x1 WITH ENDOSCOPIC VEIN HARVEST (RIGHT THIGH). SVG TO THE RIGHT CORONARY ARTERY. (N/A) Subjective: Feels good this morning. No complaints  Objective: Vital signs in last 24 hours: Temp:  [98.4 F (36.9 C)-99.6 F (37.6 C)] 98.5 F (36.9 C) (10/07 0759) Pulse Rate:  [77-116] 98 (10/07 0700) Cardiac Rhythm: Sinus tachycardia (10/07 0400) Resp:  [20-35] 34 (10/07 0700) BP: (110-150)/(62-134) 148/82 (10/07 0700) SpO2:  [80 %-100 %] 80 % (10/07 0818) Weight:  [107.7 kg] 107.7 kg (10/07 0600)    Intake/Output from previous day: 10/06 0701 - 10/07 0700 In: 1175.7 [P.O.:1044; I.V.:17.7; IV Piggyback:114] Out: 1750 [Urine:1750] Intake/Output this shift: No intake/output data recorded.  General appearance: alert, cooperative and no distress Heart: sinus tachycardia Lungs: clear to auscultation bilaterally Abdomen: soft, non-tender; bowel sounds normal; no masses,  no organomegaly Extremities: extremities normal, atraumatic, no cyanosis or edema Wound: clean and dry  Lab Results: Recent Labs    06/23/18 0429  WBC 17.1*  HGB 10.3*  HCT 31.8*  PLT 93*   BMET:  Recent Labs    06/24/18 0349 06/25/18 0351  NA 137 135  K 3.3* 3.5  CL 105 105  CO2 25 23  GLUCOSE 111* 111*  BUN 10 9  CREATININE 0.75 0.72  CALCIUM 7.5* 7.6*    PT/INR: No results for input(s): LABPROT, INR in the last 72 hours. 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 REPLACEMENT OF AORTA (HEMI ARCH) WITH  STRAIGHT HEMASHIELD PLATINUM GRAFT. RESUSPENSION OF AORTIC VALVE. HYPOTHERMIC CIRCULATORY ARREST WITH ANTEGRADE CEREBRAL PERFUSION. (N/A) INTRAOPERATIVE TRANSESOPHAGEAL ECHOCARDIOGRAM CORONARY ARTERY BYPASS GRAFTING (CABG) x1 WITH ENDOSCOPIC VEIN HARVEST (RIGHT THIGH). SVG TO THE RIGHT CORONARY ARTERY. (N/A)  1. CV-sinus tachycardia in the low 100s, BP mostly controlled. Continue Metoprolol 50mg  BID, Lisinopril 20mg  daily. Will likely need to titrate.  2. Pulm-receiving a breathing treatment this morning. Was on high flow oxygen but now switched to [place]. Oxygen saturation good this morning. 3. Renal-0.72 creatinine, electrolytes okay-replace per protocol. Continue Lasix 4. H and H 10.3/31.8, expected acute blood loss anemia 5. Endo-blood glucose well controlled.  6. Pain is well controlled on current regimen   Plan: Cardiology consult to establish care. He lives [place] and prefers one close to there. 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 Titrate BP meds as needed. Ambulate in the halls.    LOS: 5 days    Timothy West 06/25/2018  Respiratory status improving Lower extremities neuro intact Replacing KCL I have seen and examined Timothy West and agree with the above assessment  and plan.  Delight Ovens MD Beeper [phone] Office [phone]  06/25/2018 1:45 PM

## 2018-06-26 ENCOUNTER — Inpatient Hospital Stay (HOSPITAL_COMMUNITY): Payer: Medicaid Other

## 2018-06-26 DIAGNOSIS — I7100 Dissection of unspecified site of aorta: Secondary | ICD-10-CM

## 2018-06-26 DIAGNOSIS — I10 Essential (primary) hypertension: Secondary | ICD-10-CM

## 2018-06-26 DIAGNOSIS — I495 Sick sinus syndrome: Secondary | ICD-10-CM

## 2018-06-26 LAB — CBC
HCT: 30.2 % — ABNORMAL LOW (ref 39.0–52.0)
Hemoglobin: 9.7 g/dL — ABNORMAL LOW (ref 13.0–17.0)
MCH: 30.4 pg (ref 26.0–34.0)
MCHC: 32.1 g/dL (ref 30.0–36.0)
MCV: 94.7 fL (ref 80.0–100.0)
Platelets: 274 10*3/uL (ref 150–400)
RBC: 3.19 MIL/uL — ABNORMAL LOW (ref 4.22–5.81)
RDW: 13.9 % (ref 11.5–15.5)
WBC: 13.5 10*3/uL — ABNORMAL HIGH (ref 4.0–10.5)

## 2018-06-26 LAB — COOXEMETRY PANEL
Carboxyhemoglobin: 1.8 % — ABNORMAL HIGH (ref 0.5–1.5)
Methemoglobin: 1.5 % (ref 0.0–1.5)
O2 Saturation: 66.3 %
Total hemoglobin: 12.2 g/dL (ref 12.0–16.0)

## 2018-06-26 LAB — GLUCOSE, CAPILLARY
Glucose-Capillary: 109 mg/dL — ABNORMAL HIGH (ref 70–99)
Glucose-Capillary: 95 mg/dL (ref 70–99)
Glucose-Capillary: 118 mg/dL — ABNORMAL HIGH (ref 70–99)

## 2018-06-26 LAB — BASIC METABOLIC PANEL
Anion gap: 6 (ref 5–15)
BUN: 10 mg/dL (ref 6–20)
CO2: 25 mmol/L (ref 22–32)
Calcium: 7.9 mg/dL — ABNORMAL LOW (ref 8.9–10.3)
Chloride: 105 mmol/L (ref 98–111)
Creatinine, Ser: 0.81 mg/dL (ref 0.61–1.24)
GFR calc Af Amer: >60 mL/min (ref >60)
GFR calc non Af Amer: >60 mL/min (ref >60)
Glucose, Bld: 113 mg/dL — ABNORMAL HIGH (ref 70–99)
Potassium: 3.8 mmol/L (ref 3.5–5.1)
Sodium: 136 mmol/L (ref 135–145)

## 2018-06-26 LAB — LIPID PANEL
Cholesterol: 95 mg/dL (ref 0–200)
HDL: 21 mg/dL — ABNORMAL LOW (ref >40)
LDL Cholesterol: 57 mg/dL (ref 0–99)
Total CHOL/HDL Ratio: 4.5 RATIO
Triglycerides: 85 mg/dL (ref <150)
VLDL: 17 mg/dL (ref 0–40)

## 2018-06-26 MED ORDER — PANTOPRAZOLE SODIUM 40 MG PO TBEC
40.0000 mg | DELAYED_RELEASE_TABLET | Freq: Every day | ORAL | Status: DC
  Administered 2018-06-26 – 2018-06-28 (×3): 40 mg via ORAL
  Filled 2018-06-26 (×4): qty 1

## 2018-06-26 MED ORDER — ALUM & MAG HYDROXIDE-SIMETH 200-200-20 MG/5ML PO SUSP: 15.0000 mL | ORAL | Status: DC | PRN

## 2018-06-26 MED ORDER — BISACODYL 5 MG PO TBEC: 10.0000 mg | DELAYED_RELEASE_TABLET | Freq: Every day | ORAL | Status: DC | PRN

## 2018-06-26 MED ORDER — INSULIN ASPART 100 UNIT/ML ~~LOC~~ SOLN: 0.0000 [IU] | Freq: Three times a day (TID) | SUBCUTANEOUS | Status: DC

## 2018-06-26 MED ORDER — ONDANSETRON HCL 4 MG PO TABS: 4.0000 mg | ORAL_TABLET | Freq: Four times a day (QID) | ORAL | Status: DC | PRN

## 2018-06-26 MED ORDER — MOVING RIGHT ALONG BOOK
Freq: Once | Status: AC
  Administered 2018-06-26: 09:00:00
  Filled 2018-06-26: qty 1

## 2018-06-26 MED ORDER — ONDANSETRON HCL 4 MG/2ML IJ SOLN: 4.0000 mg | Freq: Four times a day (QID) | INTRAMUSCULAR | Status: DC | PRN

## 2018-06-26 MED ORDER — SODIUM CHLORIDE 0.9% FLUSH
3.0000 mL | Freq: Two times a day (BID) | INTRAVENOUS | Status: DC
  Administered 2018-06-26 – 2018-06-28 (×5): 3 mL via INTRAVENOUS

## 2018-06-26 MED ORDER — METOPROLOL TARTRATE 100 MG PO TABS
100.0000 mg | ORAL_TABLET | Freq: Two times a day (BID) | ORAL | Status: DC
  Administered 2018-06-26 – 2018-06-28 (×4): 100 mg via ORAL
  Filled 2018-06-26: qty 1
  Filled 2018-06-26: qty 2
  Filled 2018-06-26 (×2): qty 1

## 2018-06-26 MED ORDER — ACETAMINOPHEN 325 MG PO TABS: 650.0000 mg | ORAL_TABLET | Freq: Four times a day (QID) | ORAL | Status: DC | PRN

## 2018-06-26 MED ORDER — OXYCODONE HCL 5 MG PO TABS
5.0000 mg | ORAL_TABLET | ORAL | Status: DC | PRN
  Administered 2018-06-26 – 2018-06-28 (×15): 10 mg via ORAL
  Filled 2018-06-26 (×16): qty 2

## 2018-06-26 MED ORDER — BISACODYL 10 MG RE SUPP: 10.0000 mg | Freq: Every day | RECTAL | Status: DC | PRN

## 2018-06-26 MED ORDER — SODIUM CHLORIDE 0.9% FLUSH: 3.0000 mL | INTRAVENOUS | Status: DC | PRN

## 2018-06-26 MED ORDER — ASPIRIN EC 81 MG PO TBEC
81.0000 mg | DELAYED_RELEASE_TABLET | Freq: Every day | ORAL | Status: DC
  Administered 2018-06-26 – 2018-06-28 (×3): 81 mg via ORAL
  Filled 2018-06-26 (×3): qty 1

## 2018-06-26 MED ORDER — SODIUM CHLORIDE 0.9 % IV SOLN: 250.0000 mL | INTRAVENOUS | Status: DC | PRN

## 2018-06-26 MED ORDER — TRAMADOL HCL 50 MG PO TABS
50.0000 mg | ORAL_TABLET | ORAL | Status: DC | PRN
  Filled 2018-06-26: qty 2

## 2018-06-26 MED ORDER — LISINOPRIL 10 MG PO TABS
10.0000 mg | ORAL_TABLET | Freq: Every day | ORAL | Status: DC
  Administered 2018-06-27 – 2018-06-28 (×2): 10 mg via ORAL
  Filled 2018-06-26 (×2): qty 1

## 2018-06-26 MED ORDER — ENOXAPARIN SODIUM 30 MG/0.3ML ~~LOC~~ SOLN
30.0000 mg | SUBCUTANEOUS | Status: DC
  Administered 2018-06-26 – 2018-06-28 (×3): 30 mg via SUBCUTANEOUS
  Filled 2018-06-26 (×4): qty 0.3

## 2018-06-26 NOTE — Plan of Care (Signed)
  Problem: Education: Goal: Knowledge of General Education information will improve Description: Including pain rating scale, medication(s)/side effects and non-pharmacologic comfort measures Outcome: Progressing   Problem: Health Behavior/Discharge Planning: Goal: Ability to manage health-related needs will improve Outcome: Progressing   Problem: Clinical Measurements: Goal: Ability to maintain clinical measurements within normal limits will improve Outcome: Progressing Goal: Will remain free from infection Outcome: Progressing Goal: Diagnostic test results will improve Outcome: Progressing Goal: Respiratory complications will improve Outcome: Progressing Goal: Cardiovascular complication will be avoided Outcome: Progressing   Problem: Activity: Goal: Risk for activity intolerance will decrease Outcome: Progressing   Problem: Nutrition: Goal: Adequate nutrition will be maintained Outcome: Progressing   Problem: Coping: Goal: Level of anxiety will decrease Outcome: Progressing   Problem: Elimination: Goal: Will not experience complications related to bowel motility Outcome: Progressing Goal: Will not experience complications related to urinary retention Outcome: Progressing   Problem: Pain Managment: Goal: General experience of comfort will improve Outcome: Progressing   Problem: Safety: Goal: Ability to remain free from injury will improve Outcome: Progressing   Problem: Skin Integrity: Goal: Risk for impaired skin integrity will decrease Outcome: Progressing   Problem: Education: Goal: Will demonstrate proper wound care and an understanding of methods to prevent future damage Outcome: Progressing Goal: Knowledge of disease or condition will improve Outcome: Progressing Goal: Knowledge of the prescribed therapeutic regimen will improve Outcome: Progressing Goal: Individualized Educational Video(s) Outcome: Progressing   Problem: Activity: Goal: Risk for  activity intolerance will decrease Outcome: Progressing   Problem: Cardiac: Goal: Will achieve and/or maintain hemodynamic stability Outcome: Progressing   Problem: Clinical Measurements: Goal: Postoperative complications will be avoided or minimized Outcome: Progressing   Problem: Respiratory: Goal: Respiratory status will improve Outcome: Progressing   Problem: Skin Integrity: Goal: Wound healing without signs and symptoms of infection Outcome: Progressing Goal: Risk for impaired skin integrity will decrease Outcome: Progressing   Problem: Urinary Elimination: Goal: Ability to achieve and maintain adequate renal perfusion and functioning will improve Outcome: Progressing   Problem: Education: Goal: Will demonstrate proper wound care and an understanding of methods to prevent future damage Outcome: Progressing Goal: Knowledge of disease or condition will improve Outcome: Progressing Goal: Knowledge of the prescribed therapeutic regimen will improve Outcome: Progressing Goal: Individualized Educational Video(s) Outcome: Progressing   Problem: Activity: Goal: Risk for activity intolerance will decrease Outcome: Progressing   Problem: Cardiac: Goal: Will achieve and/or maintain hemodynamic stability Outcome: Progressing   Problem: Clinical Measurements: Goal: Postoperative complications will be avoided or minimized Outcome: Progressing   Problem: Respiratory: Goal: Respiratory status will improve Outcome: Progressing   Problem: Skin Integrity: Goal: Wound healing without signs and symptoms of infection Outcome: Progressing Goal: Risk for impaired skin integrity will decrease Outcome: Progressing   Problem: Urinary Elimination: Goal: Ability to achieve and maintain adequate renal perfusion and functioning will improve Outcome: Progressing   

## 2018-06-26 NOTE — Progress Notes (Signed)
Patient's mom asked this nurse for assistance in getting paperwork from DSS for patient to apply for disability and medicaid.  This RN called Chrystie Nose in case management who referred the paperwork back to the surgeon.  She advised they cannot fill out that paperwork.  This RN called Dr. Dennie Maizes physical office and spoke to Darl Pikes at the front desk.  She advised that the paperwork could be filled out there for a $29 fee.  This information was relayed to the patient and his mom.  Patient telling mom "just don't even worry about it.I won't get but $40/day if approved."  Address and telephone number for Dr. Dennie Maizes office with mother who verbalized appreciation.

## 2018-06-26 NOTE — Progress Notes (Signed)
Patient ID: Timothy West, male   DOB: 1961-05-08, 57 y.o.   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 REPLACEMENT OF AORTA (HEMI ARCH) WITH  STRAIGHT HEMASHIELD PLATINUM GRAFT. RESUSPENSION OF AORTIC VALVE. HYPOTHERMIC CIRCULATORY ARREST WITH ANTEGRADE CEREBRAL PERFUSION. (N/A) INTRAOPERATIVE TRANSESOPHAGEAL ECHOCARDIOGRAM CORONARY ARTERY BYPASS GRAFTING (CABG) x1 WITH ENDOSCOPIC VEIN HARVEST (RIGHT THIGH). SVG TO THE RIGHT CORONARY ARTERY. (N/A)  Total Length of Stay:  LOS: 6 days  BP 121/73   Pulse (!) 105   Temp 99.7 F (37.6 C) (Oral)   Resp (!) 28   Ht 6\' 2"  (1.88 m)   Wt 107 kg   SpO2 (!) 89%   BMI 30.29 kg/m   .Intake/Output      10/07 0701 - 10/08 0700 10/08 0701 - 10/09 0700   P.O. 460 580   I.V. (mL/kg) 49.9 (0.5)    IV Piggyback 133.4    Total Intake(mL/kg) 643.3 (6) 580 (5.4)   Urine (mL/kg/hr) 1885 (0.7) 225 (0.2)   Stool 0    Total Output 1885 225   Net -1241.7 +355        Urine Occurrence  200 x   Stool Occurrence 1 x      . sodium chloride       Lab Results  Component Value Date   WBC 13.5 (H) 06/26/2018   HGB 9.7 (L) 06/26/2018   HCT 30.2 (L) 06/26/2018   PLT 274 06/26/2018   GLUCOSE 113 (H) 06/26/2018   CHOL 95 06/26/2018   TRIG 85 06/26/2018   HDL 21 (L) 06/26/2018   LDLCALC 57 06/26/2018   ALT 24 06/23/2018   AST 34 06/23/2018   NA 136 06/26/2018   K 3.8 06/26/2018   CL 105 06/26/2018   CREATININE 0.81 06/26/2018   BUN 10 06/26/2018   CO2 25 06/26/2018   INR 0.95 06/20/2018   HGBA1C 4.9 06/25/2018   Waiting for step down bed dicussing with family about assistance at home on d/c    Delight Ovens MD  Beeper [phone] Office [phone] 06/26/2018 5:55 PM

## 2018-06-26 NOTE — Progress Notes (Signed)
Progress Note  Patient Name: Timothy West Date of Encounter: 06/26/2018  Primary Cardiologist: Dorris Carnes, MD   Subjective   Chest wall sore    Breathing is OK   Still with cough   Inpatient Medications    Scheduled Meds: . aspirin EC  81 mg Oral Daily  . atorvastatin  40 mg Oral q1800  . chlorhexidine gluconate (MEDLINE KIT)  15 mL Mouth Rinse BID  . Chlorhexidine Gluconate Cloth  6 each Topical Daily  . enoxaparin (LOVENOX) injection  30 mg Subcutaneous Q24H  . furosemide  40 mg Oral Daily  . guaiFENesin  600 mg Oral BID  . levalbuterol  0.63 mg Nebulization TID  . [START ON 06/27/2018] lisinopril  10 mg Oral Daily  . metoprolol tartrate  100 mg Oral BID  . pantoprazole  40 mg Oral QAC breakfast  . potassium chloride  40 mEq Oral Daily  . sodium chloride flush  10-40 mL Intracatheter Q12H  . sodium chloride flush  3 mL Intravenous Q12H   Continuous Infusions: . sodium chloride     PRN Meds: sodium chloride, acetaminophen, alum & mag hydroxide-simeth, bisacodyl **OR** bisacodyl, ondansetron **OR** ondansetron (ZOFRAN) IV, oxyCODONE, sodium chloride flush, sodium chloride flush, traMADol   Vital Signs    Vitals:   06/26/18 1600 06/26/18 1618 06/26/18 1700 06/26/18 1745  BP: 129/84  121/73   Pulse: 97  (!) 105 (!) 107  Resp: 17  (!) 28 (!) 30  Temp:  99.7 F (37.6 C)    TempSrc:  Oral    SpO2: 95%  (!) 89% 93%  Weight:      Height:        Intake/Output Summary (Last 24 hours) at 06/26/2018 1830 Last data filed at 06/26/2018 1800 Gross per 24 hour  Intake 825 ml  Output 1350 ml  Net -525 ml   Filed Weights   06/24/18 0600 06/25/18 0600 06/26/18 0600  Weight: 108.5 kg 107.7 kg 107 kg    Telemetry    SR to ST   90s to 110 - Personally Reviewed  ECG      Physical Exam   GEN: No acute distress.   Neck: No JVD Cardiac: RRR, no murmurs, rubs, or gallops.  Respiratory   Bilateral rhonchi  GI: Soft, nontender, non-distended  MS: No edema; No  deformity. Neuro:  Nonfocal  Psych: Normal affect   Labs    Chemistry Recent Labs  Lab 06/19/18 2035  06/23/18 0429 06/24/18 0349 06/25/18 0351 06/26/18 0504  NA  --    < > 133* 137 135 136  K  --    < > 3.2* 3.3* 3.5 3.8  CL  --    < > 100 105 105 105  CO2  --    < > '26 25 23 25  '$ GLUCOSE  --    < > 113* 111* 111* 113*  BUN  --    < > '12 10 9 10  '$ CREATININE  --    < > 0.75 0.75 0.72 0.81  CALCIUM  --    < > 7.6* 7.5* 7.6* 7.9*  PROT 6.4*  --  5.2*  --   --   --   ALBUMIN 3.7  --  2.3*  --   --   --   AST 19  --  34  --   --   --   ALT 10  --  24  --   --   --  ALKPHOS 79  --  49  --   --   --   BILITOT 0.7  --  0.8  --   --   --   GFRNONAA  --    < > >60 >60 >60 >60  GFRAA  --    < > >60 >60 >60 >60  ANIONGAP  --    < > '7 7 7 6   '$ < > = values in this interval not displayed.     Hematology Recent Labs  Lab 06/22/18 0525 06/23/18 0429 06/26/18 0504  WBC 20.1* 17.1* 13.5*  RBC 3.36* 3.40* 3.19*  HGB 10.2* 10.3* 9.7*  HCT 31.3* 31.8* 30.2*  MCV 93.2 93.5 94.7  MCH 30.4 30.3 30.4  MCHC 32.6 32.4 32.1  RDW 14.3 13.9 13.9  PLT 75* 93* 274    Cardiac EnzymesNo results for input(s): TROPONINI in the last 168 hours.  Recent Labs  Lab 06/19/18 2032  TROPIPOC 0.01     BNPNo results for input(s): BNP, PROBNP in the last 168 hours.   DDimer No results for input(s): DDIMER in the last 168 hours.   Radiology    Dg Chest Port 1 View  Result Date: 06/26/2018 CLINICAL DATA:  Status post coronary bypass grafting EXAM: PORTABLE CHEST 1 VIEW COMPARISON:  06/23/2018 FINDINGS: Cardiac shadow is stable. Postsurgical changes are again seen. Left PICC line is again noted in the mid superior vena cava. Some slight increase in patchy infiltrate in the right base is noted. Mild left basilar atelectasis is noted as well. No bony abnormality is seen. IMPRESSION: Slight increase in bibasilar infiltrates/atelectasis. Electronically Signed   By: Inez Catalina M.D.   On: 06/26/2018  09:32    Cardiac Studies     Patient Profile     57 y.o. male  S/p Type I Aortic Dissection   He is s/p repair with 1 V CABG  Assessment & Plan    1  Type I Aortic Dissection   S/p repair on 10/1     2   Sinus tachycardia    Continue b blocker   INcrease to 100 bid    BP is adeqae  3  HTN  Will taper back on ACE I to 10   Increase bblocker  4  Pulmonary   Lots of secretions  Encouraged coughing and use of incentive spirometry  5  Lipids   ON statin     For questions or updates, please contact [facility] Please consult [email or web address] for contact info under        Signed, Dorris Carnes, MD  06/26/2018, 6:30 PM

## 2018-06-26 NOTE — Progress Notes (Signed)
Patient ID: Timothy West, male   DOB: 1960-11-08, 57 y.o.   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 REPLACEMENT OF AORTA (HEMI ARCH) WITH  32MM STRAIGHT HEMASHIELD PLATINUM GRAFT. RESUSPENSION OF AORTIC VALVE. HYPOTHERMIC CIRCULATORY ARREST WITH ANTEGRADE CEREBRAL PERFUSION. (N/A) INTRAOPERATIVE TRANSESOPHAGEAL ECHOCARDIOGRAM CORONARY ARTERY BYPASS GRAFTING (CABG) x1 WITH ENDOSCOPIC VEIN HARVEST (RIGHT THIGH). SVG TO THE RIGHT CORONARY ARTERY. (N/A)  Total Length of Stay:  LOS: 6 days   Subjective: Patient awake alert neurologically intact, walked around the unit this morning  Objective: Vital signs in last 24 hours: Temp:  [97.9 F (36.6 C)-99.1 F (37.3 C)] 97.9 F (36.6 C) (10/08 0423) Pulse Rate:  [88-111] 90 (10/08 0600) Cardiac Rhythm: Sinus tachycardia (10/07 2000) Resp:  [16-32] 21 (10/08 0600) BP: (112-158)/(65-90) 128/79 (10/08 0600) SpO2:  [80 %-100 %] 95 % (10/08 0735) Weight:  [855 kg] 107 kg (10/08 0600)  Filed Weights   06/24/18 0600 06/25/18 0600 06/26/18 0600  Weight: 108.5 kg 107.7 kg 107 kg    Weight change: -0.7 kg   Hemodynamic parameters for last 24 hours:    Intake/Output from previous day: 10/07 0701 - 10/08 0700 In: 643.3 [P.O.:460; I.V.:49.9; IV Piggyback:133.4] Out: 1885 [Urine:1885]  Intake/Output this shift: No intake/output data recorded.  Current Meds: Scheduled Meds: . aspirin EC  81 mg Oral Daily   Or  . aspirin  81 mg Per Tube Daily  . atorvastatin  40 mg Oral q1800  . bisacodyl  10 mg Oral Daily   Or  . bisacodyl  10 mg Rectal Daily  . chlorhexidine gluconate (MEDLINE KIT)  15 mL Mouth Rinse BID  . Chlorhexidine Gluconate Cloth  6 each Topical Daily  . docusate sodium  200 mg Oral Daily  . furosemide  40 mg Oral Daily  .  guaiFENesin  600 mg Oral BID  . levalbuterol  0.63 mg Nebulization TID  . lisinopril  20 mg Oral Daily  . metoprolol tartrate  75 mg Oral BID  . pantoprazole  40 mg Oral Daily  . potassium chloride  40 mEq Oral Daily  . sodium chloride flush  10-40 mL Intracatheter Q12H  . sodium chloride flush  3 mL Intravenous Q12H   Continuous Infusions: . sodium chloride Stopped (06/25/18 1538)   PRN Meds:.sodium chloride, acetaminophen, [START ON 06/27/2018] Influenza vac split quadrivalent PF, labetalol, metoprolol tartrate, morphine injection, ondansetron (ZOFRAN) IV, oxyCODONE, [START ON 06/27/2018] pneumococcal 13-valent conjugate vaccine, sodium chloride flush, sodium chloride flush, traMADol  General appearance: alert, cooperative and appears stated age Neurologic: intact Heart: regular rate and rhythm, S1, S2 normal, no murmur, click, rub or gallop Lungs: diminished breath sounds bibasilar Abdomen: soft, non-tender; bowel sounds normal; no masses,  no organomegaly Extremities: extremities normal, atraumatic, no cyanosis or edema and Homans sign is negative, no sign of DVT Wound: Sternum stable wound intact, right arm neurovascular intact without evidence of brachial plexus injury  Lab Results: CBC: Recent Labs    06/26/18 0504  WBC 13.5*  HGB 9.7*  HCT 30.2*  PLT 274   BMET:  Recent Labs    06/25/18 0351 06/26/18 0504  NA 135 136  K 3.5 3.8  CL 105 105  CO2 23 25  GLUCOSE 111* 113*  BUN 9 10  CREATININE 0.72 0.81  CALCIUM 7.6* 7.9*    CMET: Lab Results  Component Value Date   WBC 13.5 (H) 06/26/2018   HGB 9.7 (L) 06/26/2018   HCT 30.2 (L) 06/26/2018   PLT 274 06/26/2018   GLUCOSE 113 (H) 06/26/2018   CHOL 95 06/26/2018   TRIG 85 06/26/2018   HDL 21 (L) 06/26/2018   LDLCALC 57 06/26/2018   ALT 24 06/23/2018   AST 34 06/23/2018   NA 136 06/26/2018   K 3.8 06/26/2018   CL 105 06/26/2018   CREATININE 0.81 06/26/2018   BUN 10 06/26/2018   CO2 25 06/26/2018   INR  0.95 06/20/2018   HGBA1C 4.9 06/25/2018      PT/INR: No results for input(s): LABPROT, INR in the last 72 hours. Radiology: No results found.   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 REPLACEMENT OF AORTA (HEMI ARCH) WITH  32MM STRAIGHT HEMASHIELD PLATINUM GRAFT. RESUSPENSION OF AORTIC VALVE. HYPOTHERMIC CIRCULATORY ARREST WITH ANTEGRADE CEREBRAL PERFUSION. (N/A) INTRAOPERATIVE TRANSESOPHAGEAL ECHOCARDIOGRAM CORONARY ARTERY BYPASS GRAFTING (CABG) x1 WITH ENDOSCOPIC VEIN HARVEST (RIGHT THIGH). SVG TO THE RIGHT CORONARY ARTERY. (N/A) Mobilize Diuresis Plan for transfer to step-down: see transfer orders Continued blood pressure management, cardiologist seen yesterday in consultation    Grace Isaac 06/26/2018 7:36 AM

## 2018-06-26 NOTE — Discharge Summary (Signed)
Physician Discharge Summary  Patient ID: Timothy West MRN: [id number] DOB/AGE: [birth year] 57 y.o.  Admit date: 06/19/2018 Discharge date: 06/28/2018  Admission Diagnoses:  Patient Active Problem List   Diagnosis Date Noted  . Pressure injury of skin 06/21/2018  . S/P ascending aortic replacement 06/20/2018    Discharge Diagnoses:  Active Problems:   S/P ascending aortic replacement   Pressure injury of skin   Discharged Condition: good  HPI:   Patient with no previous cardiac history other than hypertension noted while lying on the sofa approximately 730pm  sudden onset of chest pain.  He stood to drive himself to the emergency room found that his legs were very weak and he had to lay back down.  He now presents to the [facility] emergency room, with ongoing chest pain and poor motion of his lower extremities with numbness.  His upper body is mottled.  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  He has no family history of aortic dissection.  Only other previous history is significant for previous ulcer disease  Hospital Course:   On 06/19/2018 Timothy West underwent a repair of an acute type a aortic dissection with Dr. Tyrone Sage.  He tolerated procedure well was transferred to the surgical ICU.  He was extubated in a timely manner.  Postop day 1 he remained in normal sinus rhythm.  He was on milrinone, nitroglycerin, and dopamine which was recently stopped.  We added some oral medication for blood pressure control to try and wean the nitroglycerin.  We kept his chest tubes in place due to high output.  His renal function remained stable.  He did have a slight drop in urine output after the discontinuation of dopamine.  Postop day 4 he remained hemodynamically stable and a normal sinus rhythm.  We continued antihypertensive medications.  He was diuresing well but remained about 15 pounds over his baseline weight.  We continue low-dose dopamine to enhance diuresis.  Postop day 5 he remained  hemodynamically stable.  We increased his Lopressor for better blood pressure and heart rate control.  We discontinued hydralazine.  We were able to wean off dopamine.  He continued to diuresis well and his weight was trending down.  Postop day 6 he remained in normal sinus rhythm to sinus tachycardia.  We were weaning oxygen as tolerated.  We consulted cardiology to establish care.  We replaced his potassium and continue Lasix.  Postop day 7 he continued to improve.  We mobilize the patient.  He was stable for transfer to the telemetry unit for continued care.  On the floor he continued to progress.  His epicardial pacing wires were removed on 06/27/2018.  He remained in normal sinus rhythm.  We did provide him with 3 days of diuretic on discharge for fluid overload.  Baseline echocardiogram was obtained by Dr. Tenny Craw.  He will follow-up outpatient with cardiology.  He is ambulating with limited assistance, his incision is healing well, he is tolerating room air, and he is ready for discharge home.  Consults: cardiology  Significant Diagnostic Studies:   CLINICAL DATA:  Status post coronary bypass grafting  EXAM: PORTABLE CHEST 1 VIEW  COMPARISON:  06/23/2018  FINDINGS: Cardiac shadow is stable. Postsurgical changes are again seen. Left PICC line is again noted in the mid superior vena cava. Some slight increase in patchy infiltrate in the right base is noted. Mild left basilar atelectasis is noted as well. No bony abnormality is seen.  IMPRESSION: Slight increase in bibasilar infiltrates/atelectasis.  Electronically Signed   By: Alcide Clever M.D.   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  SURGICAL PROCEDURE PERFORMED:  Repair of type 1 aortic dissection with resuspension of aortic valve, replacement of ascending aorta and hemiarch with a Hemashield 32 mm graft.  SURGICAL PROCEDURE:  Coronary artery bypass grafting x1 with reverse saphenous vein graft to the distal right coronary artery right thigh greater saphenous endoscopic vein harvest, hypothermic circulatory arrest with antegrade cerebral perfusion, right  axillary arterial cannulation.  SURGEON:  Sheliah Plane, MD  FIRST ASSISTANT: Dr Durene Cal  Second Assistant: Lowella Dandy, PA-C   Discharge Exam: Blood pressure 120/80, pulse 99, temperature 97.8 F (36.6 C), temperature source Oral, resp. rate (!) 24, height 6\' 2"  (1.88 m), weight 106 kg, SpO2 96 %.   General appearance: alert, cooperative and no distress Heart: regular rate and rhythm, S1, S2 normal, no murmur, click, rub or gallop Lungs: clear to auscultation bilaterally Abdomen: soft, non-tender; bowel sounds normal; no masses,  no organomegaly Extremities: extremities normal, atraumatic, no cyanosis or edema Wound: clean and dry  Disposition: Discharge disposition: 01-Home or Self Care       Discharge Instructions    Amb Referral to Cardiac Rehabilitation   Complete by:  As directed    To [place]   Diagnosis:   Valve Replacement CABG     Valve:  Aortic   CABG X ___:  1     Allergies as of 06/28/2018   No Known Allergies     Medication List    STOP taking these medications   GOODY HEADACHE PO     TAKE these medications   acetaminophen 325 MG tablet Commonly known as:  TYLENOL Take 2 tablets (650 mg total) by mouth every 6 (six) hours as needed for mild pain.   aspirin 81 MG EC tablet Take 1 tablet (81 mg total) by mouth daily.   atorvastatin 40 MG tablet Commonly known as:  LIPITOR Take 1 tablet (40 mg total) by mouth  daily at 6 PM.   furosemide 40 MG tablet Commonly known as:  LASIX Take 1 tablet (40 mg total) by mouth daily for 3 days.   guaiFENesin 600 MG 12 hr tablet Commonly known as:  MUCINEX Take 1 tablet (600 mg total) by mouth 2 (two) times daily.   lisinopril 10 MG tablet Commonly known as:  PRINIVIL,ZESTRIL Take 1 tablet (10 mg total) by mouth daily.   metoprolol tartrate 100 MG tablet Commonly known as:  LOPRESSOR Take 1 tablet (100 mg total) by mouth 2 (two) times daily.   oxyCODONE 5 MG immediate release tablet Commonly known as:  Oxy IR/ROXICODONE Take 1 tablet (5 mg total) by mouth every 6 (six) hours as needed for severe pain.   Potassium Chloride ER 20 MEQ Tbcr Take 10 mEq by mouth daily for 3 doses.      Follow-up Information    [facility]. Go on 07/04/2018.   Specialty:  Internal Medicine Why:  at 1p.m.  Contact information: [address] [place] [address] [phone]       [facility] Follow up.  Why:  for prescriptions with medications $4-10.  Contact information: [address] [place] [address] [phone]       Delight Ovens, MD Follow up.   Specialty:  Cardiothoracic Surgery Why:  Your appointment is on 07/26/2018 at 9am. Please arrive at 8:30am for a chest xray located at [facility] imaging which is on the first floor of our building. Contact information: [address] [place] [address] [phone]        Baldo Daub, MD Follow up.   Specialty:  Cardiology Why:  Dr. Dulce Sellar 10/21 @ 10 am ([facility])  Contact information: [address] [place] [address] [phone]          The patient has been discharged on:   1.Beta Blocker:  Yes [  yes ]                              No   [   ]                              If No, reason:  2.Ace Inhibitor/ARB: Yes [ yes  ]                                     No  [     ]                                     If No, reason:  3.Statin:   Yes [ yes  ]                  No  [   ]                  If No, reason:  4.Ecasa:  Yes  [ yes  ]                  No   [   ]                  If No, reason:    Signed: Sharlene Dory 06/28/2018, 1:23 PM

## 2018-06-27 ENCOUNTER — Inpatient Hospital Stay (HOSPITAL_COMMUNITY): Payer: Medicaid Other

## 2018-06-27 DIAGNOSIS — I361 Nonrheumatic tricuspid (valve) insufficiency: Secondary | ICD-10-CM

## 2018-06-27 LAB — CBC
HCT: 30 % — ABNORMAL LOW (ref 39.0–52.0)
Hemoglobin: 9.4 g/dL — ABNORMAL LOW (ref 13.0–17.0)
MCH: 29.3 pg (ref 26.0–34.0)
MCHC: 31.3 g/dL (ref 30.0–36.0)
MCV: 93.5 fL (ref 80.0–100.0)
Platelets: 368 10*3/uL (ref 150–400)
RBC: 3.21 MIL/uL — ABNORMAL LOW (ref 4.22–5.81)
RDW: 13.8 % (ref 11.5–15.5)
WBC: 13.8 10*3/uL — ABNORMAL HIGH (ref 4.0–10.5)
nRBC: 0 % (ref 0.0–0.2)

## 2018-06-27 LAB — BASIC METABOLIC PANEL
Anion gap: 8 (ref 5–15)
BUN: 11 mg/dL (ref 6–20)
CO2: 25 mmol/L (ref 22–32)
Calcium: 8 mg/dL — ABNORMAL LOW (ref 8.9–10.3)
Chloride: 103 mmol/L (ref 98–111)
Creatinine, Ser: 0.73 mg/dL (ref 0.61–1.24)
GFR calc Af Amer: >60 mL/min (ref >60)
GFR calc non Af Amer: >60 mL/min (ref >60)
Glucose, Bld: 111 mg/dL — ABNORMAL HIGH (ref 70–99)
Potassium: 3.9 mmol/L (ref 3.5–5.1)
Sodium: 136 mmol/L (ref 135–145)

## 2018-06-27 LAB — ECHOCARDIOGRAM COMPLETE
Height: 74 in
Weight: 3739 oz

## 2018-06-27 LAB — TSH: TSH: 3.84 u[IU]/mL (ref 0.350–4.500)

## 2018-06-27 MED ORDER — LEVALBUTEROL HCL 0.63 MG/3ML IN NEBU
0.6300 mg | INHALATION_SOLUTION | Freq: Two times a day (BID) | RESPIRATORY_TRACT | Status: DC
  Administered 2018-06-27 – 2018-06-28 (×2): 0.63 mg via RESPIRATORY_TRACT
  Filled 2018-06-27 (×2): qty 3

## 2018-06-27 MED ORDER — MOVING RIGHT ALONG BOOK
Freq: Once | Status: AC
  Administered 2018-06-27: 16:00:00
  Filled 2018-06-27: qty 1

## 2018-06-27 NOTE — Progress Notes (Signed)
Patient transferred to 4E26 with receiving RN, Tie, and NT, Timothy West, at bedside. Patient's belongings at bedside.   Tie, RN, aware of new order to remove epicardial pacing wires - stated she will pull them when patient is back in bed. Dr. Tyrone Sage aware of the plan.

## 2018-06-27 NOTE — Discharge Instructions (Signed)
Aortic Dissection An aortic dissection happens when there is a tear in the main blood vessel of the body (aorta). The aorta comes out of the heart, curves around, and then goes down the chest (thoracic aorta) and into the abdomen (abdominal aorta) to supply arteries with blood. The wall of the aorta has inner and outer layers. Aortic dissection occurs most often in the thoracic aorta. As the tear widens and blood flows through it, the aorta becomes "double-barreled." This means that one part of the aorta continues to carry blood to the body, but blood also flows into the tear, between the layers of the aorta. The torn part of the aorta fills with blood and swells up. This can reduce blood flow through the part of the aorta that is still supplying blood to the body. Aortic dissection is a medical emergency. What are the causes? An aortic dissection is commonly caused by weakening of the artery wall due to high blood pressure. Other causes may include:  An injury, such as from a car crash.  Birth defects that affect the heart (congenital heart defects).  Thickening of the artery walls.  In some cases, the cause is not known. What increases the risk? The following factors may make you more likely to develop this condition:  Having certain medical conditions, such as: ? High blood pressure (hypertension). ? Hardening and narrowing of the arteries (atherosclerosis). ? A genetic disorder that affects the connective tissue, such as Marfan syndrome or Ehlers-Danlos syndrome. ? A condition that causes inflammation of blood vessels, such as giant cell arteritis.  Having a chest injury.  Having surgery on the aorta.  Being born with a congenital heart defect.  Being male.  Being older than age 60.  Using cocaine.  Smoking.  Lifting heavy weights or doing other types of high-intensity resistance training.  What are the signs or symptoms? Signs and symptoms of aortic dissection start  suddenly. The most common symptoms are:  Severe chest pain that may feel like tearing, stabbing, or sharp pain.  Severe pain that spreads (radiates) to the back, neck, jaw, or abdomen.  Other symptoms may include:  Trouble breathing.  Dizziness or fainting.  Sudden weakness on one side of the body.  Nausea or vomiting.  Trouble swallowing.  Coughing up blood.  Vomiting blood.  Clammy skin.  How is this diagnosed? This condition may be diagnosed based on:  Your symptoms.  A physical exam. This may include: ? Listening for abnormal blood flow sounds (murmurs) in your chest or abdomen. ? Checking your pulse in your arms and legs. ? Checking your blood pressure to see whether it is low or whether there is a difference between the measurements in your right and left arm.  Electrocardiogram (ECG). This test measures the electrical activity in your heart.  Chest X-ray.  CT scan.  MRI.  Aortic angiogram. This test involves injecting dye to make it easier to see your blood vessels clearly.  Echocardiogram to study your heart using sound waves.  Blood tests.  How is this treated? It is important to treat an aortic dissection as quickly as possible. Treatment may start as soon as your health care provider thinks that you have aortic dissection. Treatment depends on the location and severity of your dissection and your overall health. Treatment may include:  Medicines to lower your blood pressure.  Surgery to repair the dissected part of your aorta with artificial material (syntheticgraft).  A medical procedure to insert a stent-graft into   the aorta (endovascular procedure). During this procedure, a long, thin tube (stent) is inserted into an artery near the groin (femoral artery) and moved up to the damaged part of the aorta. Then, the stent is opened to help improve blood flow and prevent future dissection.  Follow these instructions at home: Activity  Avoid  activities that could injure your chest or your abdomen. Ask your health care provider what activities are safe for you.  After you have recovered, try to stay active. Ask your health care provider what activities are safe for you after recovery.  Do not lift anything that is heavier than 10 lb (4.5 kg) until your health care provider approves.  Do not drive or use heavy machinery while taking prescription pain medicine. Eating and drinking  Eat a heart-healthy diet, which includes lots of fresh fruits and vegetables, low-fat (lean) protein, and whole grains.  Check ingredients and nutrition facts on packaged foods and beverages, and avoid foods with high amounts of: ? Salt (sodium). ? Saturated fats (like red meat). ? Trans fats (like fried food). General instructions  Take over-the-counter and prescription medicines only as told by your health care provider.  Work with your health care provider to manage your blood pressure.  Talk with your health care provider about how to manage stress.  Do not use any products that contain nicotine or tobacco, such as cigarettes and e-cigarettes. If you need help quitting, ask your health care provider.  Keep all follow-up visits as told by your health care provider. This is important. Get help right away if:  You develop any symptoms of aortic dissection after treatment, including severe pain in your chest, back, or abdomen.  You have a pain in your abdomen.  You have trouble breathing or you develop a cough.  You faint.  You develop a racing heartbeat. These symptoms may represent a serious problem that is an emergency. Do not wait to see if the symptoms will go away. Get medical help right away. Call your local emergency services (911 in the [place] Do not drive yourself to the hospital. Summary  An aortic dissection happens when there is a tear in the main blood vessel of the body (aorta). It is a medical emergency.  The most common  symptom is severe pain in the chest that spreads (radiates) to the back, neck, jaw, or abdomen.  It is important to treat an aortic dissection as quickly as possible. Treatment typically includes surgery and medicines. This information is not intended to replace advice given to you by your health care provider. Make sure you discuss any questions you have with your health care provider. Document Released: 12/13/2007 Document Revised: 07/25/2016 Document Reviewed: 07/25/2016 Elsevier Interactive Patient Education  2017 Elsevier Inc.  

## 2018-06-27 NOTE — Progress Notes (Signed)
CARDIAC REHAB PHASE I   PRE:  Rate/Rhythm: 91 SR    BP: sitting 116/80    SaO2: 91 RA  MODE:  Ambulation: 470 ft   POST:  Rate/Rhythm: 103 ST    BP: sitting 126/74     SaO2: 92 RA  Pt moving well. Able to get up independently but needed reminder for sternal precautions. Supervision assist in hall, no AD needed. Pt was able to expectorate large phlegm and felt better. SaO2 91-92 RA. Encouraged IS, flutter and another walk this pm. Doing well. [id number]   Harriet Masson CES, ACSM 06/27/2018 2:33 PM

## 2018-06-27 NOTE — Progress Notes (Signed)
  Echocardiogram 2D Echocardiogram has been performed.  Leta Jungling M 06/27/2018, 11:57 AM

## 2018-06-27 NOTE — Progress Notes (Signed)
CARDIAC REHAB PHASE I   Offered to walk with pt, EPW just d/c's. Will check back later.  Reynold Bowen, RN BSN 06/27/2018 10:36 AM

## 2018-06-27 NOTE — Progress Notes (Signed)
Pacing wires was removed at 10:30 am today. No complication at this time. Vital signs remain stable. Reviewed plan of care about post pacing wires removal and post open heart surgery education given. Pt had fully understanding. No further questions at this time. Will monitor.  Charm Stenner. RN

## 2018-06-27 NOTE — Progress Notes (Addendum)
[facility] DAILY ICU PROGRESS NOTE                   [address]            [place] [address]          [phone]   8 Days Post-Op Procedure(s) (LRB): REPAIR OF ACUTE TYPE I ASCENDING AORTIC DISSECTION. REPLACEMENT OF AORTA (HEMI ARCH) WITH  32MM STRAIGHT HEMASHIELD PLATINUM GRAFT. RESUSPENSION OF AORTIC VALVE. HYPOTHERMIC CIRCULATORY ARREST WITH ANTEGRADE CEREBRAL PERFUSION. (N/A) INTRAOPERATIVE TRANSESOPHAGEAL ECHOCARDIOGRAM CORONARY ARTERY BYPASS GRAFTING (CABG) x1 WITH ENDOSCOPIC VEIN HARVEST (RIGHT THIGH). SVG TO THE RIGHT CORONARY ARTERY. (N/A)  Total Length of Stay:  LOS: 7 days   Subjective: He feels good this morning. He does have some incisional pain when he coughs.  Objective: Vital signs in last 24 hours: Temp:  [98.1 F (36.7 C)-99.8 F (37.7 C)] 98.9 F (37.2 C) (10/09 0400) Pulse Rate:  [77-109] 92 (10/09 0700) Cardiac Rhythm: Normal sinus rhythm (10/09 0000) Resp:  [17-30] 27 (10/09 0700) BP: (108-165)/(55-126) 119/80 (10/09 0700) SpO2:  [86 %-100 %] 100 % (10/09 0700) Weight:  [106 kg] 106 kg (10/09 0600)  Filed Weights   06/25/18 0600 06/26/18 0600 06/27/18 0600  Weight: 107.7 kg 107 kg 106 kg    Weight change: -1 kg   Hemodynamic parameters for last 24 hours:    Intake/Output from previous day: 10/08 0701 - 10/09 0700 In: 675 [P.O.:655] Out: 4034 [Urine:1725]  Intake/Output this shift: No intake/output data recorded.  Current Meds: Scheduled Meds: . aspirin EC  81 mg Oral Daily  . atorvastatin  40 mg Oral q1800  . chlorhexidine gluconate (MEDLINE KIT)  15 mL Mouth Rinse BID  . Chlorhexidine Gluconate Cloth  6 each Topical Daily  . enoxaparin (LOVENOX) injection  30 mg Subcutaneous Q24H  . furosemide  40 mg Oral Daily  . guaiFENesin  600 mg Oral BID  . levalbuterol  0.63 mg Nebulization TID  . lisinopril  10 mg Oral Daily  . metoprolol tartrate  100 mg Oral BID  . pantoprazole  40 mg Oral QAC breakfast  . potassium chloride   40 mEq Oral Daily  . sodium chloride flush  10-40 mL Intracatheter Q12H  . sodium chloride flush  3 mL Intravenous Q12H   Continuous Infusions: . sodium chloride     PRN Meds:.sodium chloride, acetaminophen, alum & mag hydroxide-simeth, bisacodyl **OR** bisacodyl, ondansetron **OR** ondansetron (ZOFRAN) IV, oxyCODONE, sodium chloride flush, sodium chloride flush, traMADol  General appearance: alert, cooperative and no distress Heart: regular rate and rhythm, S1, S2 normal, no murmur, click, rub or gallop and sinus tachycardia Lungs: clear to auscultation bilaterally Abdomen: soft, non-tender; bowel sounds normal; no masses,  no organomegaly Extremities: extremities normal, atraumatic, no cyanosis or edema Wound: clean and dry  Lab Results: CBC: Recent Labs    06/26/18 0504 06/27/18 0438  WBC 13.5* 13.8*  HGB 9.7* 9.4*  HCT 30.2* 30.0*  PLT 274 368   BMET:  Recent Labs    06/26/18 0504 06/27/18 0438  NA 136 136  K 3.8 3.9  CL 105 103  CO2 25 25  GLUCOSE 113* 111*  BUN 10 11  CREATININE 0.81 0.73  CALCIUM 7.9* 8.0*    CMET: Lab Results  Component Value Date   WBC 13.8 (H) 06/27/2018   HGB 9.4 (L) 06/27/2018   HCT 30.0 (L) 06/27/2018   PLT 368 06/27/2018   GLUCOSE 111 (H) 06/27/2018   CHOL  95 06/26/2018   TRIG 85 06/26/2018   HDL 21 (L) 06/26/2018   LDLCALC 57 06/26/2018   ALT 24 06/23/2018   AST 34 06/23/2018   NA 136 06/27/2018   K 3.9 06/27/2018   CL 103 06/27/2018   CREATININE 0.73 06/27/2018   BUN 11 06/27/2018   CO2 25 06/27/2018   TSH 3.840 06/27/2018   INR 0.95 06/20/2018   HGBA1C 4.9 06/25/2018      PT/INR: No results for input(s): LABPROT, INR in the last 72 hours. Radiology: No results found.   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 REPLACEMENT OF AORTA (HEMI ARCH) WITH  32MM STRAIGHT HEMASHIELD PLATINUM GRAFT. RESUSPENSION OF AORTIC VALVE. HYPOTHERMIC CIRCULATORY ARREST WITH  ANTEGRADE CEREBRAL PERFUSION. (N/A) INTRAOPERATIVE TRANSESOPHAGEAL ECHOCARDIOGRAM CORONARY ARTERY BYPASS GRAFTING (CABG) x1 WITH ENDOSCOPIC VEIN HARVEST (RIGHT THIGH). SVG TO THE RIGHT CORONARY ARTERY. (N/A)  1. CV-hypertension-tolerating metoprolol and lisinopril. Continue ASA, statin. Cardiology following and assisting. 2. Pulm-tolerating room air with good oxygenation. Continue incentive spirometer.continue diuretics 3. Renal-creatinine 0.73, electrolytes okay 4. H and H 9.4/30.0, platelets 368k-expected acute blood loss anemia 5. Endo-blood glucose well controlled   Plan: Transfer to telemetry unit. Discontinue EPW since rhythm has been stable. Continue to ambulate in the halls. Continue to use your incentive spirometer and breathing treatments.    Elgie Collard 06/27/2018 7:37 AM   I have seen and examined Timothy West and agree with the above assessment  and plan.  Grace Isaac MD Beeper [phone] Office [phone] 06/27/2018 9:10 AM

## 2018-06-28 LAB — BASIC METABOLIC PANEL
Anion gap: 8 (ref 5–15)
BUN: 9 mg/dL (ref 6–20)
CALCIUM: 8.4 mg/dL — AB (ref 8.9–10.3)
CO2: 25 mmol/L (ref 22–32)
CREATININE: 0.84 mg/dL (ref 0.61–1.24)
Chloride: 105 mmol/L (ref 98–111)
GFR calc Af Amer: >60 mL/min (ref >60)
GFR calc non Af Amer: >60 mL/min (ref >60)
Glucose, Bld: 125 mg/dL — ABNORMAL HIGH (ref 70–99)
Potassium: 3.8 mmol/L (ref 3.5–5.1)
SODIUM: 138 mmol/L (ref 135–145)

## 2018-06-28 MED ORDER — GUAIFENESIN ER 600 MG PO TB12: 600.0000 mg | ORAL_TABLET | Freq: Two times a day (BID) | ORAL | Status: DC

## 2018-06-28 MED ORDER — ATORVASTATIN CALCIUM 40 MG PO TABS: 40.0000 mg | ORAL_TABLET | Freq: Every day | ORAL | 1 refills | Status: DC

## 2018-06-28 MED ORDER — FUROSEMIDE 40 MG PO TABS: 40.0000 mg | ORAL_TABLET | Freq: Every day | ORAL | 0 refills | Status: DC

## 2018-06-28 MED ORDER — POTASSIUM CHLORIDE ER 20 MEQ PO TBCR: 10.0000 meq | EXTENDED_RELEASE_TABLET | Freq: Every day | ORAL | 0 refills | Status: DC

## 2018-06-28 MED ORDER — ASPIRIN 81 MG PO TBEC: 81.0000 mg | DELAYED_RELEASE_TABLET | Freq: Every day | ORAL | Status: DC

## 2018-06-28 MED ORDER — ACETAMINOPHEN 325 MG PO TABS: 650.0000 mg | ORAL_TABLET | Freq: Four times a day (QID) | ORAL | Status: DC | PRN

## 2018-06-28 MED ORDER — FUROSEMIDE 10 MG/ML IJ SOLN
40.0000 mg | Freq: Once | INTRAMUSCULAR | Status: AC
  Administered 2018-06-28: 40 mg via INTRAVENOUS
  Filled 2018-06-28: qty 4

## 2018-06-28 MED ORDER — POTASSIUM CHLORIDE CRYS ER 20 MEQ PO TBCR
20.0000 meq | EXTENDED_RELEASE_TABLET | Freq: Once | ORAL | Status: AC
  Administered 2018-06-28: 20 meq via ORAL
  Filled 2018-06-28: qty 1

## 2018-06-28 MED ORDER — OXYCODONE HCL 5 MG PO TABS: 5.0000 mg | ORAL_TABLET | Freq: Four times a day (QID) | ORAL | 0 refills | Status: DC | PRN

## 2018-06-28 MED ORDER — LISINOPRIL 10 MG PO TABS: 10.0000 mg | ORAL_TABLET | Freq: Every day | ORAL | 1 refills | Status: DC

## 2018-06-28 MED ORDER — METOPROLOL TARTRATE 100 MG PO TABS: 100.0000 mg | ORAL_TABLET | Freq: Two times a day (BID) | ORAL | 1 refills | Status: DC

## 2018-06-28 MED FILL — LISINOPRIL 10 MG TABS: 10 | 30 days supply | Qty: 30 | Fill #0

## 2018-06-28 MED FILL — ATORVASTATIN CALCIUM 40 MG: 40 | 30 days supply | Qty: 30 | Fill #0

## 2018-06-28 MED FILL — oxyCODONE HCL 5 MG TABS: 5 | 7 days supply | Qty: 30 | Fill #0

## 2018-06-28 MED FILL — FUROSEMIDE 40 MG TABLET: 40 | 3 days supply | Qty: 3 | Fill #0

## 2018-06-28 MED FILL — METOPROLOL TARTRATE 50 MG T: 50 | 30 days supply | Qty: 120 | Fill #0

## 2018-06-28 MED FILL — POTASSIUM CL ER 20 MEQ TABL: 20 | 3 days supply | Qty: 3 | Fill #0

## 2018-06-28 NOTE — Progress Notes (Signed)
      [address]       [place] [address]             [phone]      9 Days Post-Op Procedure(s) (LRB): REPAIR OF ACUTE TYPE I ASCENDING AORTIC DISSECTION. REPLACEMENT OF AORTA (HEMI ARCH) WITH  STRAIGHT HEMASHIELD PLATINUM GRAFT. RESUSPENSION OF AORTIC VALVE. HYPOTHERMIC CIRCULATORY ARREST WITH ANTEGRADE CEREBRAL PERFUSION. (N/A) INTRAOPERATIVE TRANSESOPHAGEAL ECHOCARDIOGRAM CORONARY ARTERY BYPASS GRAFTING (CABG) x1 WITH ENDOSCOPIC VEIN HARVEST (RIGHT THIGH). SVG TO THE RIGHT CORONARY ARTERY. (N/A) Subjective: No issues overnight. All questions regarding the video answered.   Objective: Vital signs in last 24 hours: Temp:  [97.5 F (36.4 C)-99.1 F (37.3 C)] 98.5 F (36.9 C) (10/09 2008) Pulse Rate:  [86-101] 97 (10/09 2008) Cardiac Rhythm: Normal sinus rhythm (10/09 1900) Resp:  [19-34] 29 (10/09 2008) BP: (107-133)/(65-80) 133/80 (10/09 2008) SpO2:  [93 %-97 %] 97 % (10/09 2008)     Intake/Output from previous day: 10/09 0701 - 10/10 0700 In: 840 [P.O.:840] Out: 2 [Urine:2] Intake/Output this shift: No intake/output data recorded.  General appearance: alert, cooperative and no distress Heart: regular rate and rhythm, S1, S2 normal, no murmur, click, rub or gallop Lungs: clear to auscultation bilaterally Abdomen: soft, non-tender; bowel sounds normal; no masses,  no organomegaly Extremities: extremities normal, atraumatic, no cyanosis or edema Wound: clean and dry  Lab Results: Recent Labs    06/26/18 0504 06/27/18 0438  WBC 13.5* 13.8*  HGB 9.7* 9.4*  HCT 30.2* 30.0*  PLT 274 368   BMET:  Recent Labs    06/26/18 0504 06/27/18 0438  NA 136 136  K 3.8 3.9  CL 105 103  CO2 25 25  GLUCOSE 113* 111*  BUN 10 11  CREATININE 0.81 0.73  CALCIUM 7.9* 8.0*    PT/INR: No results for input(s): LABPROT, INR in the last 72 hours. ABG    Component Value Date/Time   PHART 7.438 06/20/2018 2104   HCO3 25.8 06/20/2018 2104   TCO2 23  06/21/2018 1730   ACIDBASEDEF 1.0 06/20/2018 0840   O2SAT 66.3 06/26/2018 0505   CBG (last 3)  Recent Labs    06/26/18 0806 06/26/18 1105 06/26/18 1617  GLUCAP 109* 95 118*    Assessment/Plan: S/P Procedure(s) (LRB): REPAIR OF ACUTE TYPE I ASCENDING AORTIC DISSECTION. REPLACEMENT OF AORTA (HEMI ARCH) WITH  STRAIGHT HEMASHIELD PLATINUM GRAFT. RESUSPENSION OF AORTIC VALVE. HYPOTHERMIC CIRCULATORY ARREST WITH ANTEGRADE CEREBRAL PERFUSION. (N/A) INTRAOPERATIVE TRANSESOPHAGEAL ECHOCARDIOGRAM CORONARY ARTERY BYPASS GRAFTING (CABG) x1 WITH ENDOSCOPIC VEIN HARVEST (RIGHT THIGH). SVG TO THE RIGHT CORONARY ARTERY. (N/A)  1. CV-hypertension-tolerating metoprolol and lisinopril. Continue ASA, statin. Cardiology following and assisting. EPW removed. Echocardiogram reviewed.  2. Pulm-tolerating room air with good oxygenation. Continue incentive spirometer.Continue diuretics 3. Renal-creatinine 0.73, electrolytes okay 4. H and H 9.4/30.0, platelets 368k-expected acute blood loss anemia 5. Endo-blood glucose well controlled  Plan: Discharge today. Will give three days of lasix since he remains above his baseline weight.    LOS: 8 days    Sharlene Dory 06/28/2018

## 2018-06-28 NOTE — Progress Notes (Signed)
Ed completed with good reception. Will refer to [facility] but pt will probably exercise on his own. He wants to quit smoking and we had a long discussion of this. Resources given, declined fake cigarette. [phone] Ethelda Chick CES, ACSM 10:23 AM 06/28/2018

## 2018-06-28 NOTE — Care Management Note (Signed)
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 Patient listed in Epic as uninsured with no PCP. Referral made to the financial counselor by Crawford Givens, CSW, who also met with patient's mother regarding applying for Medicaid, Food Stamps and disability. CM to continue to follow to assist with needs.   Expected Discharge Date:  06/28/18               Expected Discharge Plan:  Home/Self Care  In-House Referral:  Clinical Social Work, Scientist, research (medical)  CM Consult, Follow-up appt scheduled, Medication Assistance, [place] [facility]  Post Acute Care Choice:  NA Choice offered to:  NA  DME Arranged:  N/A DME Agency:  NA  HH Arranged:  NA HH Agency:  NA  Status of Service:  Completed, signed off  If discussed at [employer], dates discussed:    Additional Comments  06/28/18- 1100- Drusilla Wampole RN, CM- pt for discharge home today- CM will assist pt with medications through [facility]- have spoken with Nicole Kindred at [facility]- plan to fill meds here and deliver to bedside using [facility] program- letter for [facility] placed on shadow chart along with scripts.   06/25/18 @ 1151-Natalie Gay RNCM-CM met with patient to discuss transitional care needs. Patient reported being independent and living at home alone PTA with no DME in use. Patient indicated to CM not having a local PCP or health insurance, but was agreeable to CM arranging a hospital f/u appointment in [place]. Patient verbalized his mother lives 15 minutes from his residence and would be available to check on his progress post transition, provide transportation home and also to his f/u appointments. CM arranged a  hospital f/u appointment at: [facility] on July 04, 2018 @ 1p; CM discussed [facility] for Rx needs for $4-10 medications, with patient verbalized being able to afford with AVS updated. CM will monitor discharge Rx costs once determined for possible MATCH via the [place] service if patient states he's unable to afford costs prior to transition home. CM will continue to follow for needs.    Marvetta Gibbons [place], RN 06/28/2018, 2:26 PM [phone] 4E Transitions of Care RN Case Manager

## 2018-06-28 NOTE — Progress Notes (Signed)
AVS and all discharge instruction gaven to Pt. Education of self care was completed. Medication management and instruction given to Pt. He had fully understanding and had no further questions at this time. All of Pt belongings gave back to him.Peripheral IV and PICC line taken out by IV team. Pt left the unit at 4:50 pm via wheelchair and a nurse and his 2 family members accompany.   Althea Grimmer, RN

## 2018-06-28 NOTE — Progress Notes (Signed)
Progress Note  Patient Name: Timothy West Date of Encounter: 06/28/2018  Primary Cardiologist: Dorris Carnes, MD   Subjective   Chest still Sore   No SOB    Inpatient Medications    Scheduled Meds: . aspirin EC  81 mg Oral Daily  . atorvastatin  40 mg Oral q1800  . chlorhexidine gluconate (MEDLINE KIT)  15 mL Mouth Rinse BID  . Chlorhexidine Gluconate Cloth  6 each Topical Daily  . enoxaparin (LOVENOX) injection  30 mg Subcutaneous Q24H  . guaiFENesin  600 mg Oral BID  . levalbuterol  0.63 mg Nebulization BID  . lisinopril  10 mg Oral Daily  . metoprolol tartrate  100 mg Oral BID  . pantoprazole  40 mg Oral QAC breakfast  . sodium chloride flush  10-40 mL Intracatheter Q12H  . sodium chloride flush  3 mL Intravenous Q12H   Continuous Infusions: . sodium chloride     PRN Meds: sodium chloride, acetaminophen, alum & mag hydroxide-simeth, bisacodyl **OR** bisacodyl, ondansetron **OR** ondansetron (ZOFRAN) IV, oxyCODONE, sodium chloride flush, sodium chloride flush, traMADol   Vital Signs    Vitals:   06/27/18 1515 06/27/18 2001 06/27/18 2008 06/28/18 0759  BP: 123/65  133/80   Pulse: 88  97   Resp: (!) 24  (!) 29 (!) 24  Temp: 98.3 F (36.8 C)  98.5 F (36.9 C)   TempSrc: Oral  Oral   SpO2: 93% 96% 97% 92%  Weight:      Height:        Intake/Output Summary (Last 24 hours) at 06/28/2018 0959 Last data filed at 06/27/2018 1716 Gross per 24 hour  Intake 480 ml  Output 2 ml  Net 478 ml   Filed Weights   06/25/18 0600 06/26/18 0600 06/27/18 0600  Weight: 107.7 kg 107 kg 106 kg    Telemetry    SR  70s to 90s- Personally Reviewed  ECG      Physical Exam   GEN: No acute distress.   Neck: No JVD Cardiac: RRR, no murmurs, rubs, or gallops.  Respiratory   Mild rhonchi  GI: Soft, nontender, non-distended  MS: Tr edema; No deformity. Neuro:  Nonfocal  Psych: Normal affect   Labs    Chemistry Recent Labs  Lab 06/23/18 0429  06/25/18 0351  06/26/18 0504 06/27/18 0438  NA 133*   < > 135 136 136  K 3.2*   < > 3.5 3.8 3.9  CL 100   < > 105 105 103  CO2 26   < > '23 25 25  '$ GLUCOSE 113*   < > 111* 113* 111*  BUN 12   < > '9 10 11  '$ CREATININE 0.75   < > 0.72 0.81 0.73  CALCIUM 7.6*   < > 7.6* 7.9* 8.0*  PROT 5.2*  --   --   --   --   ALBUMIN 2.3*  --   --   --   --   AST 34  --   --   --   --   ALT 24  --   --   --   --   ALKPHOS 49  --   --   --   --   BILITOT 0.8  --   --   --   --   GFRNONAA >60   < > >60 >60 >60  GFRAA >60   < > >60 >60 >60  ANIONGAP 7   < > '7 6 8   '$ < > =  values in this interval not displayed.     Hematology Recent Labs  Lab 06/23/18 0429 06/26/18 0504 06/27/18 0438  WBC 17.1* 13.5* 13.8*  RBC 3.40* 3.19* 3.21*  HGB 10.3* 9.7* 9.4*  HCT 31.8* 30.2* 30.0*  MCV 93.5 94.7 93.5  MCH 30.3 30.4 29.3  MCHC 32.4 32.1 31.3  RDW 13.9 13.9 13.8  PLT 93* 274 368    Cardiac EnzymesNo results for input(s): TROPONINI in the last 168 hours.  No results for input(s): TROPIPOC in the last 168 hours.   BNPNo results for input(s): BNP, PROBNP in the last 168 hours.   DDimer No results for input(s): DDIMER in the last 168 hours.   Radiology    Dg Chest 2 View  Result Date: 06/27/2018 CLINICAL DATA:  Status post replacement of the ascending aorta for dissection 9 days ago with resuspension of the aortic valve. EXAM: CHEST - 2 VIEW COMPARISON:  Portable chest x-ray of June 26, 2018 FINDINGS: The lungs are well-expanded. The interstitial markings are less prominent. Bibasilar atelectasis has improved somewhat greatest on the right. A small right pleural effusion persists. The cardiac silhouette is mildly enlarged. The pulmonary vascularity is less engorged. The left PICC line tip projects over the midportion of the SVC. The sternal wires are intact. IMPRESSION: Decreased bibasilar atelectasis. Small right pleural effusion. Decreased pulmonary edema and pulmonary vascular congestion. Electronically Signed   By:  David  Martinique M.D.   On: 06/27/2018 11:36    Cardiac Studies     Patient Profile     57 y.o. male  S/p Type I Aortic Dissection   He is s/p repair with 1 V CABG  Assessment & Plan    1  Type I Aortic Dissection   S/p repair on 06/19/18    2   Sinus tachycardia   HR improved on current regimen     I would give 1 IV dose lasix later   3  HTN  BP is good   4  Pulmonary   Imprved   Says he is done with smoking 5  Lipids   ON statin     For questions or updates, please contact [facility] Please consult [email or web address] for contact info under        Signed, Dorris Carnes, MD  06/28/2018, 9:59 AM

## 2018-07-03 ENCOUNTER — Telehealth: Payer: Self-pay

## 2018-07-03 NOTE — Telephone Encounter (Signed)
Called, left message to advised pt of appointment for 07/04/2018. Asked to call and reschedule if unable to make it. Thanks!  

## 2018-07-04 ENCOUNTER — Ambulatory Visit: Payer: Self-pay | Admitting: Family Medicine

## 2018-07-06 ENCOUNTER — Other Ambulatory Visit: Payer: Self-pay

## 2018-07-07 DIAGNOSIS — I517 Cardiomegaly: Secondary | ICD-10-CM

## 2018-07-07 DIAGNOSIS — I119 Hypertensive heart disease without heart failure: Secondary | ICD-10-CM

## 2018-07-07 HISTORY — DX: Hypertensive heart disease without heart failure: I11.9

## 2018-07-07 HISTORY — DX: Cardiomegaly: I51.7

## 2018-07-07 NOTE — Progress Notes (Signed)
Cardiology Office Note:    Date:  07/09/2018   ID:  Timothy West, DOB November 09, 1960, MRN [id number]  PCP:  No primary care provider on file.  Cardiologist:  Shirlee More, MD    Referring MD: No ref. provider found    ASSESSMENT:    1. S/P ascending aortic replacement   2. Hypertensive heart disease without heart failure   3. LVH (left ventricular hypertrophy)   4. CAD in native artery   5. Dyslipidemia    PLAN:    In order of problems listed above:  1. He is slowly improving stable course follow-up with CT surgery and hopefully attend cardiac rehabilitation which will allow him to regain his strength encourage compliance with ongoing medical therapy and can have a psychological evaluation. 2. Stable blood pressure at target systolic less than 338 on beta-blocker ACE inhibitor I asked him to check home blood pressures daily record and stressed the need for compliance of medications 3. Continue current treatment 4. Stable after single-vessel bypass 5. Stable continue his high intensity statin will check liver function lipid profile   Next appointment: 6 weeks   Medication Adjustments/Labs and Tests Ordered: Current medicines are reviewed at length with the patient today.  Concerns regarding medicines are outlined above.  Orders Placed This Encounter  Procedures  . CBC  . Comp Met (CMET)  . Lipid Profile  . EKG 12-Lead   No orders of the defined types were placed in this encounter.   Chief Complaint  Patient presents with  . Follow-up    after thoracic aortic disection and suregery  . Hypertension    History of Present Illness:    Timothy West is a 57 y.o. 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  He was last seen by see [facility] heart care Dr. Harrington Challenger 06/28/2008. Compliance with diet, lifestyle and  medications: Yes  He seems to be slowly progressing he continues to do his incentive spirometry drawing about 1500 cc he is not short of breath no edema palpitation or syncope.  He feels fatigue and complains bitterly of having pain in the morning when he first gets out of bed and coughs discussed additional pain medications I referred him to CT surgery.  I advised him into cardiac rehabilitation he agrees but wants to delay until he can drive his car.  Home blood pressures are less than 250 systolic.  He has pallor on exam I notice his last hemoglobin was 9.4 and I will recheck lab work prior to his visit with CT surgery 07/26/2018 Past Medical History:  Diagnosis Date  . Coronary artery disease   . Hypertension   . Pressure injury of skin 06/21/2018  . S/P ascending aortic replacement 06/20/2018    Past Surgical History:  Procedure Laterality Date  . CORONARY ARTERY BYPASS GRAFT N/A 06/19/2018   Procedure: CORONARY ARTERY BYPASS GRAFTING (CABG) x1 WITH ENDOSCOPIC VEIN HARVEST (RIGHT THIGH). SVG TO THE RIGHT CORONARY ARTERY.;  Surgeon: Grace Isaac, MD;  Location: [facility];  Service: Open Heart Surgery;  Laterality: N/A;  . INTRAOPERATIVE TRANSESOPHAGEAL ECHOCARDIOGRAM  06/19/2018   Procedure: INTRAOPERATIVE TRANSESOPHAGEAL ECHOCARDIOGRAM;  Surgeon: Grace Isaac, MD;  Location: [facility] OR;  Service: Open Heart Surgery;;  . REPLACEMENT ASCENDING AORTA N/A 06/19/2018   Procedure: REPAIR OF ACUTE TYPE I ASCENDING AORTIC DISSECTION. REPLACEMENT OF AORTA (HEMI ARCH) WITH  32MM  STRAIGHT HEMASHIELD PLATINUM GRAFT. RESUSPENSION OF AORTIC VALVE. HYPOTHERMIC CIRCULATORY ARREST WITH ANTEGRADE CEREBRAL PERFUSION.;  Surgeon: Grace Isaac, MD;  Location: [place];  Service: Open Heart Surgery;  Laterality: N/A;    Current Medications: Current Meds  Medication Sig  . acetaminophen (TYLENOL) 325 MG tablet Take 2 tablets (650 mg total) by mouth every 6 (six) hours as needed for mild pain.  Marland Kitchen aspirin EC 81  MG EC tablet Take 1 tablet (81 mg total) by mouth daily.  Marland Kitchen atorvastatin (LIPITOR) 40 MG tablet Take 1 tablet (40 mg total) by mouth daily at 6 PM.  . guaiFENesin (MUCINEX) 600 MG 12 hr tablet Take 1 tablet (600 mg total) by mouth 2 (two) times daily.  Marland Kitchen lisinopril (PRINIVIL,ZESTRIL) 10 MG tablet Take 1 tablet (10 mg total) by mouth daily.  . metoprolol tartrate (LOPRESSOR) 100 MG tablet Take 1 tablet (100 mg total) by mouth 2 (two) times daily.  Marland Kitchen oxyCODONE (OXY IR/ROXICODONE) 5 MG immediate release tablet Take 1 tablet (5 mg total) by mouth every 6 (six) hours as needed for severe pain.     Allergies:   Patient has no known allergies.   Social History   Socioeconomic History  . Marital status: Single    Spouse name: Not on file  . Number of children: Not on file  . Years of education: Not on file  . Highest education level: Not on file  Occupational History  . Not on file  Social Needs  . Financial resource strain: Not on file  . Food insecurity:    Worry: Not on file    Inability: Not on file  . Transportation needs:    Medical: Not on file    Non-medical: Not on file  Tobacco Use  . Smoking status: Former Smoker    Years: 30.00    Types: Cigarettes    Last attempt to quit: 06/19/2018    Years since quitting: 0.0  . Smokeless tobacco: Never Used  Substance and Sexual Activity  . Alcohol use: Not Currently  . Drug use: Not Currently  . Sexual activity: Yes  Lifestyle  . Physical activity:    Days per week: Not on file    Minutes per session: Not on file  . Stress: Not on file  Relationships  . Social connections:    Talks on phone: Not on file    Gets together: Not on file    Attends religious service: Not on file    Active member of club or organization: Not on file    Attends meetings of clubs or organizations: Not on file    Relationship status: Not on file  Other Topics Concern  . Not on file  Social History Narrative  . Not on file     Family  History: The patient's family history includes Hypertension in his brother and mother; Thyroid disease in his mother. ROS:   Please see the history of present illness.    All other systems reviewed and are negative.  EKGs/Labs/Other Studies Reviewed:    The following studies were reviewed today:  EKG:  EKG ordered today.  The ekg ordered today demonstrates sinus rhythm normal  Recent Labs: 06/21/2018: Magnesium 1.9 06/23/2018: ALT 24 06/27/2018: Hemoglobin 9.4; Platelets 368; TSH 3.840 06/28/2018: BUN 9; Creatinine, Ser 0.84; Potassium 3.8; Sodium 138  Recent Lipid Panel    Component Value Date/Time   CHOL 95 06/26/2018 0504   TRIG 85 06/26/2018 0504   HDL 21 (L) 06/26/2018 5409  CHOLHDL 4.5 06/26/2018 0504   VLDL 17 06/26/2018 0504   LDLCALC 57 06/26/2018 0504    Physical Exam:    VS:  BP 118/86 (BP Location: Right Arm, Patient Position: Sitting, Cuff Size: Large)   Pulse 89   Ht '6\' 2"'$  (1.88 m)   Wt 218 lb 6.4 oz (99.1 kg)   SpO2 98%   BMI 28.04 kg/m     Wt Readings from Last 3 Encounters:  07/09/18 218 lb 6.4 oz (99.1 kg)  06/27/18 233 lb 11 oz (106 kg)     GEN: His mood is depressed his affect is flat he has pallor of his membranes well nourished, well developed in no acute distress HEENT: Normal NECK: No JVD; No carotid bruits LYMPHATICS: No lymphadenopathy CARDIAC: RRR, no murmurs, rubs, gallops RESPIRATORY:  Clear to auscultation without rales, wheezing or rhonchi  ABDOMEN: Soft, non-tender, non-distended MUSCULOSKELETAL:  No edema; No deformity  SKIN: Warm and dry NEUROLOGIC:  Alert and oriented x 3 PSYCHIATRIC:  Normal affect    Signed, Shirlee More, MD  07/09/2018 11:13 AM    [place]

## 2018-07-09 ENCOUNTER — Encounter: Payer: Self-pay | Admitting: Cardiology

## 2018-07-09 ENCOUNTER — Ambulatory Visit (INDEPENDENT_AMBULATORY_CARE_PROVIDER_SITE_OTHER): Payer: Medicaid Other | Admitting: Cardiology

## 2018-07-09 ENCOUNTER — Ambulatory Visit (INDEPENDENT_AMBULATORY_CARE_PROVIDER_SITE_OTHER): Payer: Medicaid Other | Admitting: Family Medicine

## 2018-07-09 ENCOUNTER — Encounter: Payer: Self-pay | Admitting: Family Medicine

## 2018-07-09 VITALS — BP 120/74 | HR 91 | Temp 97.7°F | Ht 74.0 in | Wt 217.6 lb

## 2018-07-09 VITALS — BP 118/86 | HR 89 | Ht 74.0 in | Wt 218.4 lb

## 2018-07-09 DIAGNOSIS — I2510 Atherosclerotic heart disease of native coronary artery without angina pectoris: Secondary | ICD-10-CM

## 2018-07-09 DIAGNOSIS — Z09 Encounter for follow-up examination after completed treatment for conditions other than malignant neoplasm: Secondary | ICD-10-CM | POA: Diagnosis not present

## 2018-07-09 DIAGNOSIS — I119 Hypertensive heart disease without heart failure: Secondary | ICD-10-CM

## 2018-07-09 DIAGNOSIS — Z95828 Presence of other vascular implants and grafts: Secondary | ICD-10-CM

## 2018-07-09 DIAGNOSIS — I517 Cardiomegaly: Secondary | ICD-10-CM | POA: Diagnosis not present

## 2018-07-09 DIAGNOSIS — E785 Hyperlipidemia, unspecified: Secondary | ICD-10-CM

## 2018-07-09 DIAGNOSIS — Z7689 Persons encountering health services in other specified circumstances: Secondary | ICD-10-CM

## 2018-07-09 DIAGNOSIS — R0789 Other chest pain: Secondary | ICD-10-CM

## 2018-07-09 DIAGNOSIS — R42 Dizziness and giddiness: Secondary | ICD-10-CM

## 2018-07-09 HISTORY — DX: Atherosclerotic heart disease of native coronary artery without angina pectoris: I25.10

## 2018-07-09 HISTORY — DX: Hyperlipidemia, unspecified: E78.5

## 2018-07-09 LAB — POCT URINALYSIS DIP (MANUAL ENTRY)
Bilirubin, UA: NEGATIVE
Blood, UA: NEGATIVE
Glucose, UA: NEGATIVE mg/dL
Ketones, POC UA: NEGATIVE mg/dL
Leukocytes, UA: NEGATIVE
Nitrite, UA: NEGATIVE
Protein Ur, POC: NEGATIVE mg/dL
Spec Grav, UA: 1.02 (ref 1.010–1.025)
Urobilinogen, UA: 0.2 E.U./dL
pH, UA: 5.5 (ref 5.0–8.0)

## 2018-07-09 MED ORDER — LISINOPRIL 10 MG PO TABS: 10.0000 mg | ORAL_TABLET | Freq: Every day | ORAL | 3 refills | Status: DC

## 2018-07-09 MED ORDER — ASPIRIN 81 MG PO TBEC: 81.0000 mg | DELAYED_RELEASE_TABLET | Freq: Every day | ORAL | 3 refills | Status: AC

## 2018-07-09 MED ORDER — ACETAMINOPHEN 500 MG PO TABS: 500.0000 mg | ORAL_TABLET | Freq: Four times a day (QID) | ORAL | 3 refills | Status: DC | PRN

## 2018-07-09 MED ORDER — ATORVASTATIN CALCIUM 40 MG PO TABS: 40.0000 mg | ORAL_TABLET | Freq: Every day | ORAL | 3 refills | Status: DC

## 2018-07-09 MED ORDER — METOPROLOL TARTRATE 100 MG PO TABS: 100.0000 mg | ORAL_TABLET | Freq: Two times a day (BID) | ORAL | 3 refills | Status: DC

## 2018-07-09 MED ORDER — OXYCODONE HCL 5 MG PO TABS: 5.0000 mg | ORAL_TABLET | Freq: Four times a day (QID) | ORAL | 0 refills | Status: DC | PRN

## 2018-07-09 MED ORDER — GUAIFENESIN ER 600 MG PO TB12: 600.0000 mg | ORAL_TABLET | Freq: Two times a day (BID) | ORAL | 3 refills | Status: DC

## 2018-07-09 NOTE — Patient Instructions (Signed)
Medication Instructions:  Your physician recommends that you continue on your current medications as directed. Please refer to the Current Medication list given to you today.  If you need a refill on your cardiac medications before your next appointment, please call your pharmacy.   Lab work: You will have lab work done today:  CMP, CBC, Lipids   If you have labs (blood work) drawn today and your tests are completely normal, you will receive your results only by: Marland Kitchen MyChart Message (if you have MyChart) OR . A paper copy in the mail If you have any lab test that is abnormal or we need to change your treatment, we will call you to review the results.  Testing/Procedures: You had an EKG today.  Dr Dulce Sellar has referred you to cardiac rehab at [facility].    Follow-Up: At [facility], you and your health needs are our priority.  As part of our continuing mission to provide you with exceptional heart care, we have created designated Provider Care Teams.  These Care Teams include your primary Cardiologist (physician) and Advanced Practice Providers (APPs -  Physician Assistants and Nurse Practitioners) who all work together to provide you with the care you need, when you need it. You will need a follow up appointment in 2 months.     Any Other Special Instructions Will Be Listed Below (If Applicable).

## 2018-07-09 NOTE — Patient Instructions (Signed)
Cooking With Less Salt  Cooking with less salt is one way to reduce the amount of sodium you get from food. Depending on your condition and overall health, your health care provider or diet and nutrition specialist (dietitian) may recommend that you reduce your sodium intake. Most people should have less than 2,300 milligrams (mg) of sodium each day. If you have high blood pressure (hypertension), you may need to limit your sodium to 1,500 mg each day. Follow the tips below to help reduce your sodium intake.  What do I need to know about cooking with less salt?  Shopping   Buy sodium-free or low-sodium products. Look for the following words on food labels:  ? Low-sodium.  ? Sodium-free.  ? Reduced-sodium.  ? No salt added.  ? Unsalted.   Buy fresh or frozen vegetables. Avoid canned vegetables.   Avoid buying meats or protein foods that have been injected with broth or saline solution.   Avoid cured or smoked meats, such as hot dogs, bacon, salami, ham, and bologna.  Reading food labels   Check the food label before buying or using packaged ingredients.   Look for products with no more than 140 mg of sodium in one serving.   Do not choose foods with salt as one of the first three ingredients on the ingredients list. If salt is one of the first three ingredients, it usually means the item is high in sodium, because ingredients are listed in order of amount in the food item.  Cooking   Use herbs, seasonings without salt, and spices as substitutes for salt in foods.   Use sodium-free baking soda when baking.   Grill, braise, or roast foods to add flavor with less salt.   Avoid adding salt to pasta, rice, or hot cereals while cooking.   Drain and rinse canned vegetables before use.   Avoid adding salt when cooking sweets and desserts.   Cook with low-sodium ingredients.  What are some salt alternatives?  The following are herbs, seasonings, and spices that can be used instead of salt to give taste to your  food. Herbs should be fresh or dried. Do not choose packaged mixes. Next to the name of the herb, spice, or seasoning are some examples of foods you can pair it with.  Herbs   Bay leaves - Soups, meat and vegetable dishes, and spaghetti sauce.   Basil - Italian dishes, soups, pasta, and fish dishes.   Cilantro - Meat, poultry, and vegetable dishes.   Chili powder - Marinades and Mexican dishes.   Chives - Salad dressings and potato dishes.   Cumin - Mexican dishes, couscous, and meat dishes.   Dill - Fish dishes, sauces, and salads.   Fennel - Meat and vegetable dishes, breads, and cookies.   Garlic (do not use garlic salt) - Italian dishes, meat dishes, salad dressings, and sauces.   Marjoram - Soups, potato dishes, and meat dishes.   Oregano - Pizza and spaghetti sauce.   Parsley - Salads, soups, pasta, and meat dishes.   Rosemary - Italian dishes, salad dressings, soups, and red meats.   Saffron - Fish dishes, pasta, and some poultry dishes.   Sage - Stuffings and sauces.   Tarragon - Fish and poultry dishes.   Thyme - Stuffing, meat, and fish dishes.  Seasonings   Lemon juice - Fish dishes, poultry dishes, vegetables, and salads.   Vinegar - Salad dressings, vegetables, and fish dishes.  Spices   Cinnamon - Sweet   dishes, such as cakes, cookies, and puddings.   Cloves - Gingerbread, puddings, and marinades for meats.   Curry - Vegetable dishes, fish and poultry dishes, and stir-fry dishes.   Ginger - Vegetables dishes, fish dishes, and stir-fry dishes.   Nutmeg - Pasta, vegetables, poultry, fish dishes, and custard.  What are some low-sodium ingredients and foods?   Fresh or frozen fruits and vegetables with no sauce added.   Fresh or frozen whole meats, poultry, and fish with no sauce added.   Eggs.   Noodles, pasta, quinoa, rice.   Shredded or puffed wheat or puffed rice.   Regular or quick oats.   Milk, yogurt, hard cheeses, and low-sodium cheeses. Good cheese choices include  Swiss, Monterey Jack, and mozzarella. Always check the label for the serving size and sodium content.   Unsalted butter or margarine.   Unsalted nuts.   Sherbet or ice cream (keep to  cup per serving).   Homemade pudding.   Sodium-free baking soda and baking powder.  This is not a complete list of low-sodium ingredients and foods. Contact your dietitian for more options.  Summary   Cooking with less salt is one way to reduce the amount of sodium that you get from food.   Buy sodium-free or low-sodium products.   Check the food label before using or buying packaged ingredients.   Use herbs, seasonings without salt, and spices as substitutes for salt in foods.  This information is not intended to replace advice given to you by your health care provider. Make sure you discuss any questions you have with your health care provider.  Document Released: 09/05/2005 Document Revised: 09/13/2016 Document Reviewed: 09/13/2016  Elsevier Interactive Patient Education  2017 [employer]

## 2018-07-09 NOTE — Progress Notes (Signed)
New Patient--Establish Care  Subjective:    Patient ID: Timothy West, male    DOB: 06-06-1961, 57 y.o.   MRN: [id number]   Chief Complaint  Patient presents with  . Establish Care  . Hospitalization Follow-up    HPI  Timothy West is a 57 year old male with a past medical history of Ascending Aortic Replacement, Hypertension, and CAD. He is here today for Hospital Follow Up and to Establish Care.  Current Status: Since his last hospital stay, he is doing well, but continues to have pain when coughing or sudden movements. He is s/p: Repair of Ascending Aortic Replacement on 06/19/2018 and discharged on 06/28/2018. He has an abdominal surgical scar that healing well. He continues to have increased coughing, which triggers pain along his surgical scar. He currently sleeps on his back, thus he is having increase mucus production in his chest, which he takes Mucinex. He has a follow up appointment with Dr. Tyrone Sage on 117/2019. He is a former smoker of 30 years. No heart palpitations, cough and shortness of breath reported.  He is accompanied today by his mother.  He denies fevers, chills, fatigue, recent infections, weight loss, and night sweats. He has not had any headaches, visual changes, dizziness, and falls. No reports of GI problems such as nausea, vomiting, diarrhea, and constipation. He has no reports of blood in stools, dysuria and hematuria. No depression or anxiety reported. He denies pain today.    Past Medical History:  Diagnosis Date  . Coronary artery disease   . Hypertension   . Pressure injury of skin 06/21/2018  . S/P ascending aortic replacement 06/20/2018    Family History  Problem Relation Age of Onset  . Hypertension Mother   . Thyroid disease Mother   . Hypertension Brother     Social History   Socioeconomic History  . Marital status: Single    Spouse name: Not on file  . Number of children: Not on file  . Years of education: Not on file  . Highest education  level: Not on file  Occupational History  . Not on file  Social Needs  . Financial resource strain: Not on file  . Food insecurity:    Worry: Not on file    Inability: Not on file  . Transportation needs:    Medical: Not on file    Non-medical: Not on file  Tobacco Use  . Smoking status: Former Smoker    Years: 30.00    Types: Cigarettes    Last attempt to quit: 06/19/2018    Years since quitting: 0.0  . Smokeless tobacco: Never Used  Substance and Sexual Activity  . Alcohol use: Not Currently  . Drug use: Not Currently  . Sexual activity: Yes  Lifestyle  . Physical activity:    Days per week: Not on file    Minutes per session: Not on file  . Stress: Not on file  Relationships  . Social connections:    Talks on phone: Not on file    Gets together: Not on file    Attends religious service: Not on file    Active member of club or organization: Not on file    Attends meetings of clubs or organizations: Not on file    Relationship status: Not on file  . Intimate partner violence:    Fear of current or ex partner: Not on file    Emotionally abused: Not on file    Physically abused: Not on file  Forced sexual activity: Not on file  Other Topics Concern  . Not on file  Social History Narrative  . Not on file    Past Surgical History:  Procedure Laterality Date  . CORONARY ARTERY BYPASS GRAFT N/A 06/19/2018   Procedure: CORONARY ARTERY BYPASS GRAFTING (CABG) x1 WITH ENDOSCOPIC VEIN HARVEST (RIGHT THIGH). SVG TO THE RIGHT CORONARY ARTERY.;  Surgeon: Delight Ovens, MD;  Location: [facility] OR;  Service: Open Heart Surgery;  Laterality: N/A;  . INTRAOPERATIVE TRANSESOPHAGEAL ECHOCARDIOGRAM  06/19/2018   Procedure: INTRAOPERATIVE TRANSESOPHAGEAL ECHOCARDIOGRAM;  Surgeon: Delight Ovens, MD;  Location: [facility] OR;  Service: Open Heart Surgery;;  . REPLACEMENT ASCENDING AORTA N/A 06/19/2018   Procedure: REPAIR OF ACUTE TYPE I ASCENDING AORTIC DISSECTION. REPLACEMENT OF AORTA (HEMI  ARCH) WITH  STRAIGHT HEMASHIELD PLATINUM GRAFT. RESUSPENSION OF AORTIC VALVE. HYPOTHERMIC CIRCULATORY ARREST WITH ANTEGRADE CEREBRAL PERFUSION.;  Surgeon: Delight Ovens, MD;  Location: [facility] OR;  Service: Open Heart Surgery;  Laterality: N/A;    There is no immunization history for the selected administration types on file for this patient.  Current Meds  Medication Sig  . acetaminophen (TYLENOL) 500 MG tablet Take 1 tablet (500 mg total) by mouth every 6 (six) hours as needed.  Marland Kitchen aspirin 81 MG EC tablet Take 1 tablet (81 mg total) by mouth daily.  Marland Kitchen atorvastatin (LIPITOR) 40 MG tablet Take 1 tablet (40 mg total) by mouth daily at 6 PM.  . guaiFENesin (MUCINEX) 600 MG 12 hr tablet Take 1 tablet (600 mg total) by mouth 2 (two) times daily.  Marland Kitchen lisinopril (PRINIVIL,ZESTRIL) 10 MG tablet Take 1 tablet (10 mg total) by mouth daily.  . metoprolol tartrate (LOPRESSOR) 100 MG tablet Take 1 tablet (100 mg total) by mouth 2 (two) times daily.  . [DISCONTINUED] acetaminophen (TYLENOL) 325 MG tablet Take 2 tablets (650 mg total) by mouth every 6 (six) hours as needed for mild pain.  . [DISCONTINUED] aspirin EC 81 MG EC tablet Take 1 tablet (81 mg total) by mouth daily.  . [DISCONTINUED] atorvastatin (LIPITOR) 40 MG tablet Take 1 tablet (40 mg total) by mouth daily at 6 PM.  . [DISCONTINUED] guaiFENesin (MUCINEX) 600 MG 12 hr tablet Take 1 tablet (600 mg total) by mouth 2 (two) times daily.  . [DISCONTINUED] lisinopril (PRINIVIL,ZESTRIL) 10 MG tablet Take 1 tablet (10 mg total) by mouth daily.  . [DISCONTINUED] metoprolol tartrate (LOPRESSOR) 100 MG tablet Take 1 tablet (100 mg total) by mouth 2 (two) times daily.    No Known Allergies  BP 120/74 (BP Location: Left Arm, Patient Position: Sitting, Cuff Size: Small)   Pulse 91   Temp 97.7 F (36.5 C) (Oral)   Ht 6\' 2"  (1.88 m)   Wt 217 lb 9.6 oz (98.7 kg)   SpO2 98%   BMI 27.94 kg/m    Review of Systems  Constitutional: Negative.    Respiratory: Positive for cough (r/t: recent chest surgery).   Cardiovascular: Negative.   Gastrointestinal: Positive for abdominal distention (Obese).  Genitourinary: Negative.   Musculoskeletal: Positive for arthralgias (Generalized. ).  Neurological: Negative.   Psychiatric/Behavioral: Negative.    Objective:   Physical Exam  Constitutional: He appears well-developed and well-nourished.  HENT:  Head: Normocephalic and atraumatic.  Right Ear: External ear normal.  Left Ear: External ear normal.  Nose: Nose normal.  Mouth/Throat: Oropharynx is clear and moist.  Eyes: Pupils are equal, round, and reactive to light. Conjunctivae and EOM are normal.  Neck: Normal range of  motion. Neck supple.  Cardiovascular: Normal rate, regular rhythm, normal heart sounds and intact distal pulses.  Pulmonary/Chest: Effort normal and breath sounds normal.  Abdominal: Soft. Bowel sounds are normal.  Mid-line abdominal well-healed surgical scar, from sternum to navel, approximately 7-8 inches.    Musculoskeletal: Normal range of motion.  Neurological: He is alert.  Skin: Skin is warm and dry.  Psychiatric: He has a normal mood and affect. His behavior is normal. Judgment and thought content normal.  Nursing note and vitals reviewed.  Assessment & Plan:   1. Hospital discharge follow-up  2. Encounter to establish care - POCT urinalysis dipstick  3. S/P ascending aortic replacement Stable. He will keep follow up appointment with Dr. Tyrone Sage on 07/2018.  - acetaminophen (TYLENOL) 500 MG tablet; Take 1 tablet (500 mg total) by mouth every 6 (six) hours as needed.  Dispense: 30 tablet; Refill: 3 - oxyCODONE (OXY IR/ROXICODONE) 5 MG immediate release tablet; Take 1 tablet (5 mg total) by mouth every 6 (six) hours as needed for severe pain.  Dispense: 30 tablet; Refill: 0  4. Chest wall discomfort Moderate pain upon coughing or movement. He will continue Acetaminophen as prescribed. We will give  him an additional refill on Oxy-IR for severe pain when coughing as needed.  - acetaminophen (TYLENOL) 500 MG tablet; Take 1 tablet (500 mg total) by mouth every 6 (six) hours as needed.  Dispense: 30 tablet; Refill: 3 - oxyCODONE (OXY IR/ROXICODONE) 5 MG immediate release tablet; Take 1 tablet (5 mg total) by mouth every 6 (six) hours as needed for severe pain.  Dispense: 30 tablet; Refill: 0  5. Hypertensive heart disease without heart failure Antihypertensive medications are effective. Blood pressure is 120/74 today. Continue Lisinopril and Metoprolol today as prescribed. She will continue to decrease high sodium intake, excessive alcohol intake, increase potassium intake, smoking cessation, and increase physical activity of at least 30 minutes of cardio activity daily. She will continue to follow Heart Healthy or DASH diet. - aspirin 81 MG EC tablet; Take 1 tablet (81 mg total) by mouth daily.  Dispense: 30 tablet; Refill: 3 - lisinopril (PRINIVIL,ZESTRIL) 10 MG tablet; Take 1 tablet (10 mg total) by mouth daily.  Dispense: 30 tablet; Refill: 3 - metoprolol tartrate (LOPRESSOR) 100 MG tablet; Take 1 tablet (100 mg total) by mouth 2 (two) times daily.  Dispense: 60 tablet; Refill: 3  6. Dyslipidemia Refill - atorvastatin (LIPITOR) 40 MG tablet; Take 1 tablet (40 mg total) by mouth daily at 6 PM.  Dispense: 30 tablet; Refill: 3  7. Dizziness Stable today. He will ambulate slowly. Report to office if dizziness increases.   8. Follow up He will follow up in 2 months.   Meds ordered this encounter  Medications  . acetaminophen (TYLENOL) 500 MG tablet    Sig: Take 1 tablet (500 mg total) by mouth every 6 (six) hours as needed.    Dispense:  30 tablet    Refill:  3  . aspirin 81 MG EC tablet    Sig: Take 1 tablet (81 mg total) by mouth daily.    Dispense:  30 tablet    Refill:  3  . atorvastatin (LIPITOR) 40 MG tablet    Sig: Take 1 tablet (40 mg total) by mouth daily at 6 PM.     Dispense:  30 tablet    Refill:  3  . guaiFENesin (MUCINEX) 600 MG 12 hr tablet    Sig: Take 1 tablet (600 mg total) by mouth  2 (two) times daily.    Dispense:  60 tablet    Refill:  3  . lisinopril (PRINIVIL,ZESTRIL) 10 MG tablet    Sig: Take 1 tablet (10 mg total) by mouth daily.    Dispense:  30 tablet    Refill:  3  . metoprolol tartrate (LOPRESSOR) 100 MG tablet    Sig: Take 1 tablet (100 mg total) by mouth 2 (two) times daily.    Dispense:  60 tablet    Refill:  3  . oxyCODONE (OXY IR/ROXICODONE) 5 MG immediate release tablet    Sig: Take 1 tablet (5 mg total) by mouth every 6 (six) hours as needed for severe pain.    Dispense:  30 tablet    Refill:  0   Raliegh Ip,  MSN, FNP-C [facility] [address] [place], [place] [address] [phone]

## 2018-07-10 ENCOUNTER — Telehealth: Payer: Self-pay

## 2018-07-10 LAB — COMPREHENSIVE METABOLIC PANEL
A/G RATIO: 0.8 — AB (ref 1.2–2.2)
ALT: 76 IU/L — ABNORMAL HIGH (ref 0–44)
AST: 53 IU/L — ABNORMAL HIGH (ref 0–40)
Albumin: 3.2 g/dL — ABNORMAL LOW (ref 3.5–5.5)
Alkaline Phosphatase: 137 IU/L — ABNORMAL HIGH (ref 39–117)
BUN/Creatinine Ratio: 7 — ABNORMAL LOW (ref 9–20)
BUN: 6 mg/dL (ref 6–24)
Bilirubin Total: 0.2 mg/dL (ref 0.0–1.2)
CALCIUM: 9.3 mg/dL (ref 8.7–10.2)
CHLORIDE: 100 mmol/L (ref 96–106)
CO2: 23 mmol/L (ref 20–29)
CREATININE: 0.89 mg/dL (ref 0.76–1.27)
GFR calc Af Amer: 110 mL/min/{1.73_m2} (ref >59)
GFR, EST NON AFRICAN AMERICAN: 95 mL/min/{1.73_m2} (ref >59)
Globulin, Total: 3.9 g/dL (ref 1.5–4.5)
Glucose: 93 mg/dL (ref 65–99)
Potassium: 5.6 mmol/L — ABNORMAL HIGH (ref 3.5–5.2)
Sodium: 137 mmol/L (ref 134–144)
TOTAL PROTEIN: 7.1 g/dL (ref 6.0–8.5)

## 2018-07-10 LAB — LIPID PANEL
Chol/HDL Ratio: 2.5 ratio (ref 0.0–5.0)
Cholesterol, Total: 89 mg/dL — ABNORMAL LOW (ref 100–199)
HDL: 36 mg/dL — AB (ref >39)
LDL Calculated: 31 mg/dL (ref 0–99)
Triglycerides: 110 mg/dL (ref 0–149)
VLDL Cholesterol Cal: 22 mg/dL (ref 5–40)

## 2018-07-10 LAB — CBC
Hematocrit: 39.2 % (ref 37.5–51.0)
Hemoglobin: 13 g/dL (ref 13.0–17.7)
MCH: 29.2 pg (ref 26.6–33.0)
MCHC: 33.2 g/dL (ref 31.5–35.7)
MCV: 88 fL (ref 79–97)
Platelets: 635 10*3/uL — ABNORMAL HIGH (ref 150–450)
RBC: 4.45 x10E6/uL (ref 4.14–5.80)
RDW: 12.9 % (ref 12.3–15.4)
WBC: 11.9 10*3/uL — AB (ref 3.4–10.8)

## 2018-07-10 NOTE — Telephone Encounter (Signed)
Left message for patient to return call for lab results. 

## 2018-07-10 NOTE — Telephone Encounter (Signed)
-----   Message from Brian J Munley, MD sent at 07/10/2018  7:54 AM EDT ----- K is elevated  Stop lisinopril  New amlodipine 5 mg day   Recheck CMP 1 week 

## 2018-07-11 ENCOUNTER — Telehealth: Payer: Self-pay

## 2018-07-11 DIAGNOSIS — I119 Hypertensive heart disease without heart failure: Secondary | ICD-10-CM

## 2018-07-11 DIAGNOSIS — I2510 Atherosclerotic heart disease of native coronary artery without angina pectoris: Secondary | ICD-10-CM

## 2018-07-11 DIAGNOSIS — I517 Cardiomegaly: Secondary | ICD-10-CM

## 2018-07-11 DIAGNOSIS — E785 Hyperlipidemia, unspecified: Secondary | ICD-10-CM

## 2018-07-11 MED ORDER — AMLODIPINE BESYLATE 5 MG PO TABS: 5.0000 mg | ORAL_TABLET | Freq: Every day | ORAL | 2 refills | Status: DC

## 2018-07-11 NOTE — Telephone Encounter (Signed)
Patient notified of lab results with elevated potassium level per Dr Dulce Sellar.  Patient advised to stop Lisinorpil and start amlodipine 5mg  one tablet daily, and to recheck CMP in 1 week.  Rx sent to pharmacy as requested.   Patient agreed to plan and verbalized understanding.

## 2018-07-11 NOTE — Telephone Encounter (Signed)
Patient notified of lab results with elevated potassium level per Dr Munley.  Patient advised to stop Lisinorpil and start amlodipine 5mg one tablet daily, and to recheck CMP in 1 week.  Rx sent to pharmacy as requested.   Patient agreed to plan and verbalized understanding.   

## 2018-07-11 NOTE — Telephone Encounter (Signed)
-----   Message from Baldo Daub, MD sent at 07/10/2018  7:54 AM EDT ----- K is elevated  Stop lisinopril  New amlodipine 5 mg day   Recheck CMP 1 week

## 2018-07-19 LAB — COMPREHENSIVE METABOLIC PANEL
ALT: 24 IU/L (ref 0–44)
AST: 17 IU/L (ref 0–40)
Albumin/Globulin Ratio: 1.1 — ABNORMAL LOW (ref 1.2–2.2)
Albumin: 3.7 g/dL (ref 3.5–5.5)
Alkaline Phosphatase: 126 IU/L — ABNORMAL HIGH (ref 39–117)
BUN/Creatinine Ratio: 9 (ref 9–20)
BUN: 8 mg/dL (ref 6–24)
Bilirubin Total: 0.3 mg/dL (ref 0.0–1.2)
CALCIUM: 9.2 mg/dL (ref 8.7–10.2)
CHLORIDE: 99 mmol/L (ref 96–106)
CO2: 22 mmol/L (ref 20–29)
Creatinine, Ser: 0.94 mg/dL (ref 0.76–1.27)
GFR calc Af Amer: 104 mL/min/{1.73_m2} (ref >59)
GFR, EST NON AFRICAN AMERICAN: 90 mL/min/{1.73_m2} (ref >59)
GLUCOSE: 99 mg/dL (ref 65–99)
Globulin, Total: 3.3 g/dL (ref 1.5–4.5)
POTASSIUM: 4.6 mmol/L (ref 3.5–5.2)
SODIUM: 137 mmol/L (ref 134–144)
Total Protein: 7 g/dL (ref 6.0–8.5)

## 2018-07-26 ENCOUNTER — Ambulatory Visit: Payer: Self-pay | Admitting: Cardiothoracic Surgery

## 2018-07-26 ENCOUNTER — Other Ambulatory Visit: Payer: Self-pay | Admitting: Cardiothoracic Surgery

## 2018-07-26 DIAGNOSIS — Z95828 Presence of other vascular implants and grafts: Secondary | ICD-10-CM

## 2018-07-27 ENCOUNTER — Ambulatory Visit (INDEPENDENT_AMBULATORY_CARE_PROVIDER_SITE_OTHER): Payer: Self-pay | Admitting: Cardiothoracic Surgery

## 2018-07-27 ENCOUNTER — Other Ambulatory Visit: Payer: Self-pay | Admitting: *Deleted

## 2018-07-27 ENCOUNTER — Ambulatory Visit
Admission: RE | Admit: 2018-07-27 | Discharge: 2018-07-27 | Disposition: A | Discharge status: Home / Self Care | Payer: Self-pay | Source: Ambulatory Visit | Attending: Cardiothoracic Surgery | Admitting: Cardiothoracic Surgery

## 2018-07-27 VITALS — BP 129/79 | HR 72 | Resp 20 | Ht 74.0 in | Wt 223.0 lb

## 2018-07-27 DIAGNOSIS — I712 Thoracic aortic aneurysm, without rupture, unspecified: Secondary | ICD-10-CM

## 2018-07-27 DIAGNOSIS — Z95828 Presence of other vascular implants and grafts: Secondary | ICD-10-CM

## 2018-07-27 DIAGNOSIS — Z951 Presence of aortocoronary bypass graft: Secondary | ICD-10-CM

## 2018-07-27 DIAGNOSIS — I71019 Dissection of thoracic aorta, unspecified: Secondary | ICD-10-CM

## 2018-07-27 DIAGNOSIS — I25799 Atherosclerosis of other coronary artery bypass graft(s) with unspecified angina pectoris: Secondary | ICD-10-CM

## 2018-07-27 DIAGNOSIS — Z952 Presence of prosthetic heart valve: Secondary | ICD-10-CM

## 2018-07-27 DIAGNOSIS — I7101 Dissection of thoracic aorta: Secondary | ICD-10-CM

## 2018-07-27 NOTE — Patient Instructions (Signed)

## 2018-07-27 NOTE — Progress Notes (Signed)
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  SURGICAL PROCEDURE PERFORMED:  Repair of type 1 aortic dissection with resuspension of aortic valve, replacement of ascending aorta and hemiarch with a Hemashield 32 mm graft.  SURGICAL PROCEDURE:  Coronary artery bypass grafting x1 with reverse saphenous vein graft to the distal right coronary artery right thigh greater saphenous endoscopic vein harvest, hypothermic circulatory arrest with antegrade cerebral perfusion, right  axillary arterial cannulation.  SURGEON:  Sheliah Plane, MD  History of Present Illness:     Patient returns to the office today in follow-up after his recent acute repair of type I aortic dissection with replacement of the ascending aorta resuspension of the aortic valve and coronary artery bypass grafting x1 to the right coronary artery.  The patient is making good progress in his recovery.  He is increasing his physical activity appropriately.  Is much more aware of the importance of his blood pressure control.  Denies chest pain or shortness of breath.     Past Medical History:  Diagnosis Date  . Coronary artery disease   . Hypertension   . Pressure injury of skin 06/21/2018  . 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 Years: 30.00  . Types: Cigarettes  . Last attempt to quit: 06/19/2018  . Years since quitting: 0.1  Smokeless Tobacco Never Used    Social History   Substance  and Sexual Activity  Alcohol Use Not Currently     Allergies  Allergen Reactions  . Quinolones     Current Outpatient Medications  Medication Sig Dispense Refill  . acetaminophen (TYLENOL) 500 MG tablet Take 1 tablet (500 mg total) by mouth every 6 (six) hours as needed. 30 tablet 3  . amLODipine (NORVASC) 5 MG tablet Take 1 tablet (5 mg total) by mouth daily. 30 tablet 2  . aspirin 81 MG EC tablet Take 1 tablet (81 mg total) by mouth daily. 30 tablet 3  . atorvastatin (LIPITOR) 40 MG tablet Take 1 tablet (40 mg total) by mouth daily at 6 PM. 30 tablet 3  . guaiFENesin (MUCINEX) 600 MG 12 hr tablet Take 1 tablet (600 mg total) by mouth 2 (two) times daily. 60 tablet 3  . metoprolol tartrate (LOPRESSOR) 100 MG tablet Take 1 tablet (100 mg total) by mouth 2 (two) times daily. 60 tablet 3  . oxyCODONE (OXY IR/ROXICODONE) 5 MG immediate release tablet Take 1 tablet (5 mg total) by mouth every 6 (six) hours as needed for severe pain. 30 tablet 0   No current facility-administered medications for this visit.        Physical Exam: BP 129/79   Pulse 72   Resp 20   Ht 6\' 2"  (1.88 m)   Wt 223 lb (101.2 kg)   SpO2 99% Comment: RA  BMI 28.63 kg/m   General appearance: alert and cooperative Neurologic: intact Heart: regular rate  and rhythm, S1, S2 normal, no murmur, click, rub or gallop Lungs: clear to auscultation bilaterally Abdomen: soft, non-tender; bowel sounds normal; no masses,  no organomegaly Extremities: extremities normal, atraumatic, no cyanosis or edema and Homans sign is negative, no sign of DVT Wound: Sternum is stable and well-healed right vein harvest sites well-healed right infraclavicular cannulation site is well-healed I do not appreciate any aortic insufficiency on exam  Diagnostic Studies & Laboratory data:     Recent Radiology Findings:   Dg Chest 2 View  Result Date: 07/27/2018 CLINICAL DATA:  Status post CABG and aortic valve replacement 06/19/2018. EXAM:  CHEST - 2 VIEW COMPARISON:  PA and lateral chest 06/27/2018. FINDINGS: Seven intact median sternotomy wires are unchanged. The lungs are clear. Heart size is normal. Aortic atherosclerosis is noted. No pneumothorax or pleural effusion. No acute or focal bony abnormality. Mild compression fracture at the thoracolumbar junction is noted. IMPRESSION: No acute disease. Electronically Signed   By: Drusilla Kanner M.D.   On: 07/27/2018 10:49   I have independently reviewed the above radiology studies  and reviewed the findings with the patient.    Recent Lab Findings: Lab Results  Component Value Date   WBC 11.9 (H) 07/09/2018   HGB 13.0 07/09/2018   HCT 39.2 07/09/2018   PLT 635 (H) 07/09/2018   GLUCOSE 99 07/19/2018   CHOL 89 (L) 07/09/2018   TRIG 110 07/09/2018   HDL 36 (L) 07/09/2018   LDLCALC 31 07/09/2018   ALT 24 07/19/2018   AST 17 07/19/2018   NA 137 07/19/2018   K 4.6 07/19/2018   CL 99 07/19/2018   CREATININE 0.94 07/19/2018   BUN 8 07/19/2018   CO2 22 07/19/2018   TSH 3.840 06/27/2018   INR 0.95 06/20/2018   HGBA1C 4.9 06/25/2018      Assessment / Plan:      Patient making good progress following repair of acute type I aortic dissection with resuspension of his aortic valve.  I have again reviewed with the patient the diagnosis of his acute aortic dissection which has been repaired.  Cautioned him about lifting over 20 pounds for the next 3 months as his sternum heals.  But more importantly discussed with him the need to avoid strenuous lifting/Valsalva maneuver for the rest of his life because of his history of aortic dissection with his work.  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  I do plan to see him back in 6 weeks with a follow-up CTA of the chest as a baseline following repair, echocardiograms and CTA of the chest will need to be followed  long-term.      Delight Ovens MD      [address] [place] [place] [address] Office [phone]   Beeper [phone]  07/27/2018 12:28 PM

## 2018-07-31 ENCOUNTER — Telehealth: Payer: Self-pay | Admitting: Cardiology

## 2018-07-31 ENCOUNTER — Telehealth: Payer: Self-pay

## 2018-07-31 DIAGNOSIS — E785 Hyperlipidemia, unspecified: Secondary | ICD-10-CM

## 2018-07-31 DIAGNOSIS — I119 Hypertensive heart disease without heart failure: Secondary | ICD-10-CM

## 2018-07-31 MED ORDER — METOPROLOL TARTRATE 100 MG PO TABS: 100.0000 mg | ORAL_TABLET | Freq: Two times a day (BID) | ORAL | 3 refills | Status: DC

## 2018-07-31 MED ORDER — ATORVASTATIN CALCIUM 40 MG PO TABS: 40.0000 mg | ORAL_TABLET | Freq: Every day | ORAL | 3 refills | Status: DC

## 2018-07-31 NOTE — Telephone Encounter (Signed)
Can go back on light duty at work

## 2018-07-31 NOTE — Telephone Encounter (Signed)
Best to direct to his CT surgeon just seen last week

## 2018-07-31 NOTE — Telephone Encounter (Signed)
Left a vm for patient to callback 

## 2018-07-31 NOTE — Telephone Encounter (Signed)
Patient's mother, Timothy West, called asking if patient could return to work. 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 If this is okay, patient will need a note to take to his employer. Will have Dr. Dulce Sellar advise.

## 2018-07-31 NOTE — Telephone Encounter (Signed)
Medication refilled

## 2018-07-31 NOTE — Telephone Encounter (Signed)
Katie informed to contact patient's cardiothoracic surgeon for advice on returning to work. Katie verbalized understanding. No further questions.

## 2018-08-01 ENCOUNTER — Ambulatory Visit: Payer: Self-pay | Admitting: Cardiothoracic Surgery

## 2018-09-03 NOTE — Progress Notes (Signed)
Cardiology Office Note:    Date:  09/04/2018   ID:  Timothy West, DOB August 19, 1961, MRN [id number]  PCP:  Patient, No Pcp Per  Cardiologist:  Norman Herrlich, MD    Referring MD: No ref. provider found    ASSESSMENT:    1. S/P ascending aortic replacement   2. Claudication in peripheral vascular disease (HCC)   3. Hypertensive heart disease without heart failure   4. Pure hypercholesterolemia    PLAN:    In order of problems listed above:  1. Stable he is made a good recovery following with vascular surgery due to for extensive MRA through the chest abdomen and pelvis which should help to define the nature of his claudication left lower extremity I suspect either is distal aortic iliac disease or compromise left common iliac artery. 2. See above he has limiting claudication I offered to do vascular noninvasive testing but he wants to wait for the results of his MRA LE further evaluation and treatment to CT surgery 3. Stable blood pressure target he reduce the dose of his beta-blocker with symptomatic hypotension 4. Poorly controlled he stopped atorvastatin for vaguely not feeling well and agrees to take a low dose of a low intensity statin and will recheck his liver function and lipids next visit   Next appointment: 3 months   Medication Adjustments/Labs and Tests Ordered: Current medicines are reviewed at length with the patient today.  Concerns regarding medicines are outlined above.  No orders of the defined types were placed in this encounter.  Meds ordered this encounter  Medications  . pravastatin (PRAVACHOL) 20 MG tablet    Sig: Take 1 tablet (20 mg total) by mouth every evening.    Dispense:  30 tablet    Refill:  5    Chief Complaint  Patient presents with  . Follow-up  . Hypertension    History of Present Illness:    Timothy West is a 57 y.o. 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 Compliance with diet, lifestyle and medications: Yes  His dose of beta-blocker was decreased due to symptomatic hypotension he has seen vascular surgery and is scheduled for extensive MRI thoracic abdominal aortic stenting into pelvis.  He has not improving he has limiting claudication in his left leg buttock to foot and on physical examination pulses are intact but his left femoral pulse was rolling postobstructive in nature.  I suspect he has severe PAD are compromised left iliac artery and may require revascularization.  He is having no chest pain shortness of breath palpitation or syncope. Past Medical History:  Diagnosis Date  . Coronary artery disease   . Hypertension   . Pressure injury of skin 06/21/2018  . S/P ascending aortic replacement 06/20/2018    Past Surgical History:  Procedure Laterality Date  . CORONARY ARTERY BYPASS GRAFT N/A 06/19/2018   Procedure: CORONARY ARTERY BYPASS GRAFTING (CABG) x1 WITH ENDOSCOPIC VEIN HARVEST (RIGHT THIGH). SVG TO THE RIGHT CORONARY ARTERY.;  Surgeon: Delight Ovens, MD;  Location: [facility] OR;  Service: Open Heart Surgery;  Laterality: N/A;  . INTRAOPERATIVE TRANSESOPHAGEAL ECHOCARDIOGRAM  06/19/2018   Procedure: INTRAOPERATIVE TRANSESOPHAGEAL ECHOCARDIOGRAM;  Surgeon: Delight Ovens, MD;  Location: [facility] OR;  Service: Open Heart Surgery;;  . REPLACEMENT ASCENDING AORTA N/A 06/19/2018   Procedure: REPAIR OF ACUTE TYPE I ASCENDING AORTIC  DISSECTION. REPLACEMENT OF AORTA (HEMI ARCH) WITH  STRAIGHT HEMASHIELD PLATINUM GRAFT. RESUSPENSION OF AORTIC VALVE. HYPOTHERMIC CIRCULATORY ARREST WITH ANTEGRADE CEREBRAL PERFUSION.;  Surgeon: Delight Ovens, MD;  Location[facility] OR;  Service: Open Heart Surgery;  Laterality: N/A;    Current Medications: Current Meds  Medication Sig  . acetaminophen (TYLENOL) 500 MG tablet Take 1 tablet (500 mg total)  by mouth every 6 (six) hours as needed.  Marland Kitchen amLODipine (NORVASC) 5 MG tablet Take 1 tablet (5 mg total) by mouth daily.  Marland Kitchen aspirin 81 MG EC tablet Take 1 tablet (81 mg total) by mouth daily.  . famotidine-calcium carbonate-magnesium hydroxide (PEPCID COMPLETE) 10-800-165 MG chewable tablet Chew 1 tablet by mouth daily as needed.     Allergies:   Atorvastatin and Quinolones   Social History   Socioeconomic History  . Marital status: Single    Spouse name: Not on file  . Number of children: Not on file  . Years of education: Not on file  . Highest education level: Not on file  Occupational History  . Not on file  Social Needs  . Financial resource strain: Not on file  . Food insecurity:    Worry: Not on file    Inability: Not on file  . Transportation needs:    Medical: Not on file    Non-medical: Not on file  Tobacco Use  . Smoking status: Former Smoker    Years: 30.00    Types: Cigarettes    Last attempt to quit: 06/19/2018    Years since quitting: 0.2  . Smokeless tobacco: Never Used  Substance and Sexual Activity  . Alcohol use: Not Currently  . Drug use: Not Currently  . Sexual activity: Yes  Lifestyle  . Physical activity:    Days per week: Not on file    Minutes per session: Not on file  . Stress: Not on file  Relationships  . Social connections:    Talks on phone: Not on file    Gets together: Not on file    Attends religious service: Not on file    Active member of club or organization: Not on file    Attends meetings of clubs or organizations: Not on file    Relationship status: Not on file  Other Topics Concern  . Not on file  Social History Narrative  . Not on file     Family History: The patient's family history includes Hypertension in his brother and mother; Thyroid disease in his mother. ROS:   Please see the history of present illness.    All other systems reviewed and are negative.  EKGs/Labs/Other Studies Reviewed:    The following  studies were reviewed today:    Recent Labs: 06/21/2018: Magnesium 1.9 06/27/2018: TSH 3.840 07/09/2018: Hemoglobin 13.0; Platelets 635 07/19/2018: ALT 24; BUN 8; Creatinine, Ser 0.94; Potassium 4.6; Sodium 137  Recent Lipid Panel    Component Value Date/Time   CHOL 89 (L) 07/09/2018 1100   TRIG 110 07/09/2018 1100   HDL 36 (L) 07/09/2018 1100   CHOLHDL 2.5 07/09/2018 1100   CHOLHDL 4.5 06/26/2018 0504   VLDL 17 06/26/2018 0504   LDLCALC 31 07/09/2018 1100    Physical Exam:    VS:  BP 120/80 (BP Location: Right Arm, Patient Position: Sitting, Cuff Size: Large)   Pulse 66   Ht 6\' 2"  (1.88 m)   Wt 233 lb 2 oz (105.7 kg)   SpO2 97%   BMI 29.93 kg/m  Wt Readings from Last 3 Encounters:  09/04/18 233 lb 2 oz (105.7 kg)  07/27/18 223 lb (101.2 kg)  07/09/18 217 lb 9.6 oz (98.7 kg)     GEN:  Well nourished, well developed in no acute distress HEENT: Normal NECK: No JVD; No carotid bruits LYMPHATICS: No lymphadenopathy CARDIAC: RRR, no murmurs, rubs, gallops RESPIRATORY:  Clear to auscultation without rales, wheezing or rhonchi  ABDOMEN: Soft, non-tender, non-distended MUSCULOSKELETAL:  No edema; No deformity  SKIN: Warm and dry NEUROLOGIC:  Alert and oriented x 3 PSYCHIATRIC:  Normal affect    Signed, Norman Herrlich, MD  09/04/2018 10:31 AM    [facility]

## 2018-09-04 ENCOUNTER — Ambulatory Visit (INDEPENDENT_AMBULATORY_CARE_PROVIDER_SITE_OTHER): Payer: Medicaid Other | Admitting: Cardiology

## 2018-09-04 ENCOUNTER — Encounter: Payer: Self-pay | Admitting: Cardiology

## 2018-09-04 VITALS — BP 120/80 | HR 66 | Ht 74.0 in | Wt 233.1 lb

## 2018-09-04 DIAGNOSIS — I739 Peripheral vascular disease, unspecified: Secondary | ICD-10-CM

## 2018-09-04 DIAGNOSIS — I119 Hypertensive heart disease without heart failure: Secondary | ICD-10-CM | POA: Diagnosis not present

## 2018-09-04 DIAGNOSIS — E785 Hyperlipidemia, unspecified: Secondary | ICD-10-CM

## 2018-09-04 DIAGNOSIS — E7800 Pure hypercholesterolemia, unspecified: Secondary | ICD-10-CM | POA: Diagnosis not present

## 2018-09-04 DIAGNOSIS — Z95828 Presence of other vascular implants and grafts: Secondary | ICD-10-CM

## 2018-09-04 HISTORY — DX: Peripheral vascular disease, unspecified: I73.9

## 2018-09-04 HISTORY — DX: Hyperlipidemia, unspecified: E78.5

## 2018-09-04 MED ORDER — PRAVASTATIN SODIUM 20 MG PO TABS: 20.0000 mg | ORAL_TABLET | Freq: Every evening | ORAL | 5 refills | Status: DC

## 2018-09-04 NOTE — Patient Instructions (Signed)
Medication Instructions:  Your physician has recommended you make the following change in your medication:   START: pravastatin 20mg  one tablet daily  If you need a refill on your cardiac medications before your next appointment, please call your pharmacy.   Lab work: NONE If you have labs (blood work) drawn today and your tests are completely normal, you will receive your results only by: Marland Kitchen MyChart Message (if you have MyChart) OR . A paper copy in the mail If you have any lab test that is abnormal or we need to change your treatment, we will call you to review the results.  Testing/Procedures: NONE  Follow-Up: At [facility], you and your health needs are our priority.  As part of our continuing mission to provide you with exceptional heart care, we have created designated Provider Care Teams.  These Care Teams include your primary Cardiologist (physician) and Advanced Practice Providers (APPs -  Physician Assistants and Nurse Practitioners) who all work together to provide you with the care you need, when you need it. You will need a follow up appointment in 6 months.  Please call our office 2 months in advance to schedule this appointment.

## 2018-09-05 ENCOUNTER — Telehealth: Payer: Self-pay | Admitting: *Deleted

## 2018-09-05 DIAGNOSIS — K5780 Diverticulitis of intestine, part unspecified, with perforation and abscess without bleeding: Secondary | ICD-10-CM

## 2018-09-05 NOTE — Telephone Encounter (Signed)
Mr. Fecko mother called this afternoon to inform us that he is presently in [facility] with a ruptured diverticulum being treated medically. The doctors did a CTA CHEST.  He will be there for at least 3-4 days.  Should she keep his appointment next Thursday?  I told her that I would discuss with Dr. Tyrone Sage and call her back and she voiced understanding.

## 2018-09-06 ENCOUNTER — Telehealth: Payer: Self-pay

## 2018-09-06 DIAGNOSIS — K5780 Diverticulitis of intestine, part unspecified, with perforation and abscess without bleeding: Secondary | ICD-10-CM | POA: Diagnosis not present

## 2018-09-06 NOTE — Telephone Encounter (Signed)
Patient's mother contacted and chest CT was cancelled per Dr. Dennie Maizes orders because patient was hospitalized and has been done.  Patient's mother asked for his appointment to be re-scheduled, patient is now coming in on 10/04/2018 for follow-up.   Patient's mother advised to give the office a call back if she needed to re-scheduled again, she acknowledged receipt.

## 2018-09-07 DIAGNOSIS — K5780 Diverticulitis of intestine, part unspecified, with perforation and abscess without bleeding: Secondary | ICD-10-CM | POA: Diagnosis not present

## 2018-09-08 DIAGNOSIS — K5780 Diverticulitis of intestine, part unspecified, with perforation and abscess without bleeding: Secondary | ICD-10-CM | POA: Diagnosis not present

## 2018-09-09 DIAGNOSIS — K5780 Diverticulitis of intestine, part unspecified, with perforation and abscess without bleeding: Secondary | ICD-10-CM | POA: Diagnosis not present

## 2018-09-13 ENCOUNTER — Other Ambulatory Visit: Payer: Self-pay

## 2018-09-13 ENCOUNTER — Ambulatory Visit: Payer: Self-pay | Admitting: Cardiothoracic Surgery

## 2018-09-18 ENCOUNTER — Ambulatory Visit: Payer: Self-pay | Admitting: Family Medicine

## 2018-10-04 ENCOUNTER — Other Ambulatory Visit: Payer: Self-pay | Admitting: *Deleted

## 2018-10-04 ENCOUNTER — Ambulatory Visit (INDEPENDENT_AMBULATORY_CARE_PROVIDER_SITE_OTHER): Payer: Medicaid Other | Admitting: Cardiothoracic Surgery

## 2018-10-04 ENCOUNTER — Encounter: Payer: Self-pay | Admitting: Cardiothoracic Surgery

## 2018-10-04 ENCOUNTER — Other Ambulatory Visit: Payer: Self-pay

## 2018-10-04 VITALS — BP 115/70 | HR 64 | Resp 18 | Ht 74.0 in | Wt 232.4 lb

## 2018-10-04 DIAGNOSIS — I739 Peripheral vascular disease, unspecified: Secondary | ICD-10-CM

## 2018-10-04 DIAGNOSIS — Z95828 Presence of other vascular implants and grafts: Secondary | ICD-10-CM | POA: Diagnosis not present

## 2018-10-04 NOTE — Progress Notes (Signed)
[address]       [place] [address]             [phone]      Avrom Grindel [facility] Medical Record #[id number] Date of Birth: 08-19-1961  Referring: Shaune Pollack, MD Primary Care: Patient, No Pcp Per Primary Cardiologist: Dietrich Pates, MD   Chief Complaint:   POST OP FOLLOW UP 06/19/2018  PREOPERATIVE DIAGNOSIS:  Acute type 1 aortic dissection with lower extremity paresis.  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  SURGICAL PROCEDURE PERFORMED:  Repair of type 1 aortic dissection with resuspension of aortic valve, replacement of ascending aorta and hemiarch with a Hemashield 32 mm graft.  SURGICAL PROCEDURE:  Coronary artery bypass grafting x1 with reverse saphenous vein graft to the distal right coronary artery right thigh greater saphenous endoscopic vein harvest, hypothermic circulatory arrest with antegrade cerebral perfusion, right  axillary arterial cannulation.  SURGEON:  Sheliah Plane, MD  History of Present Illness:     Patient returns to the office today in follow-up after his recent acute repair of type I aortic dissection with replacement of the ascending aorta resuspension of the aortic valve and coronary artery bypass grafting x1 to the right coronary artery.  Since last seen the patient was admitted to [facility] with abdominal pain , CTA of the chest at that time noted question of diverticulitis .the patient gives a history of being admitted for 5 days at [facility] treated for perforated bowel nonoperatively and ultimately discharged home .  The patient's main complaint at this point is burning on this soles of his feet and and numbness in his right arm if he lays on his right side .     Past Medical History:  Diagnosis Date  . Coronary artery disease   . Hypertension   . Pressure injury of skin 06/21/2018  . S/P ascending aortic replacement 06/20/2018     Social  History   Tobacco Use  Smoking Status Former Smoker  . Years: 30.00  . Types: Cigarettes  . Last attempt to quit: 06/19/2018  . Years since quitting: 0.2  Smokeless Tobacco Never Used    Social History   Substance and Sexual Activity  Alcohol Use Not Currently     Allergies  Allergen Reactions  . Atorvastatin Other (See Comments)    fatigue  . Quinolones     Current Outpatient Medications  Medication Sig Dispense Refill  . acetaminophen (TYLENOL) 500 MG tablet Take 1 tablet (500 mg total) by mouth every 6 (six) hours as needed. 30 tablet 3  . amLODipine (NORVASC) 5 MG tablet Take 1 tablet (5 mg total) by mouth daily. 30 tablet 2  . aspirin 81 MG EC tablet Take 1 tablet (81 mg total) by mouth daily. 30 tablet 3  . famotidine-calcium carbonate-magnesium hydroxide (PEPCID COMPLETE) 10-800-165 MG chewable tablet Chew 1 tablet by mouth daily as needed.    . metoprolol tartrate (LOPRESSOR) 100 MG tablet Take 1 tablet (100 mg total) by mouth 2 (two) times daily. (Patient taking differently: Take 100 mg by mouth daily. ) 60 tablet 3  . pravastatin (PRAVACHOL) 20 MG tablet Take 1 tablet (20 mg total) by mouth every evening. 30 tablet 5   No current facility-administered medications for this visit.        Physical Exam: BP 115/70 (BP Location: Right Arm, Patient Position: Sitting, Cuff Size: Normal)   Pulse 64   Resp 18  Ht 6\' 2"  (1.88 m)   Wt 232 lb 6.4 oz (105.4 kg)   SpO2 98% Comment: RA  BMI 29.84 kg/m  General appearance: alert, cooperative and no distress Head: Normocephalic, without obvious abnormality, atraumatic Neck: no adenopathy, no carotid bruit, no JVD, supple, symmetrical, trachea midline and thyroid not enlarged, symmetric, no tenderness/mass/nodules Lymph nodes: Cervical, supraclavicular, and axillary nodes normal. Resp: clear to auscultation bilaterally Back: symmetric, no curvature. ROM normal. No CVA tenderness. Cardio: regular rate and rhythm, S1, S2  normal, no murmur, click, rub or gallop GI: soft, non-tender; bowel sounds normal; no masses,  no organomegaly Extremities: extremities normal, atraumatic, no cyanosis or edema and Homans sign is negative, no sign of DVT Neurologic: Grossly normal I do not appreciate any aortic insufficiency on exam Skin lesion right upper arm:   Diagnostic Studies & Laboratory data:     Recent Radiology Findings:  CLINICAL DATA: Abdominal pain. Previous aortic surgery 06/19/2018  EXAM: CT ANGIOGRAPHY CHEST, ABDOMEN AND PELVIS  TECHNIQUE: Multidetector CT imaging through the chest, abdomen and pelvis was performed using the standard protocol during bolus administration of intravenous contrast. Multiplanar reconstructed images and MIPs were obtained and reviewed to evaluate the vascular anatomy.  CONTRAST: 100 mL Isovue 370 IV  COMPARISON: 06/19/2018 preop  FINDINGS: CTA CHEST FINDINGS  Cardiovascular: Changes of interval tube graft repair of the ascending aorta and CABG. Heart size normal. No pericardial effusion. Satisfactory opacification of pulmonary arteries noted, and there is no evidence of pulmonary emboli. Good contrast opacification of the thoracic aorta. Persistent dissection flap in the arch and descending thoracic segment with patency of true and false lumens, mild compression of the true lumen in the mid descending segment as was seen preop.  There is classic 3 vessel brachiocephalic arterial origin anatomy. The dissection flap extends into the right innominate artery and is seen in the proximal common carotid artery as before. Dilatation of proximal descending thoracic aortic segment 4.6 cm (previously 4.1 cm) tapering to 3 cm above the diaphragm.  Mediastinum/Nodes: Near complete resolution of mediastinal hematoma. Patent vein graft to the right coronary artery. Calcified right hilar lymph node consistent with old granulomatous disease. No new hilar or mediastinal  adenopathy.  Lungs/Pleura: No pleural effusion. No free air. Lungs are clear.  Musculoskeletal: Changes of median sternotomy. Anterior vertebral endplate spurring at multiple levels in the lower thoracic spine. Stable T10 compression deformity.  Review of the MIP images confirms the above findings.  CTA ABDOMEN AND PELVIS FINDINGS  VASCULAR  Aorta: Dissection flap extends through the abdominal aorta with improved flow in the true lumen and continued patency of the false lumen. No aneurysm.  Celiac: Patent, supplied by true lumen  SMA: Patent, supplied by true lumen  Renals: There are 2 left renal arteries, inferior dominant, both supplied by the false lumen.  Single right renal artery, supplied by true lumen, widely patent.  IMA: Patent, supplied by true lumen  Inflow: The dissection flap extends into the proximal common iliac arteries. On the left, there is some narrowing of the true lumen secondary to distended false lumen, and incomplete opacification of the false lumen which may simply be due to early scan timing. On the right, the true lumen is widely patent.  External iliac arteries and common femoral arteries are widely patent bilaterally.  Veins: No obvious venous abnormality within the limitations of this arterial phase study.  Review of the MIP images confirms the above findings.  NON-VASCULAR  Hepatobiliary: No focal liver abnormality is  seen. No gallstones, gallbladder wall thickening, or biliary dilatation.  Pancreas: Unremarkable. No pancreatic ductal dilatation or surrounding inflammatory changes.  Spleen: Normal in size without focal abnormality.  Adrenals/Urinary Tract: Normal adrenals.  Stable right renal cysts. No hydronephrosis.  Globally decreased enhancement of left renal parenchyma probably related to slower flow in the false lumen of the aortic dissection which supplies both of the left renal arteries.  Urinary bladder incompletely  distended  Stomach/Bowel: Stomach is incompletely distended. The small bowel is decompressed. Normal appendix. The colon is nondilated. Multiple sigmoid diverticula. New wall thickening and inflammatory/edematous changes involving mid sigmoid colon. No discrete abscess.  Lymphatic: No abdominal or pelvic adenopathy.  Reproductive: Mild prostatic enlargement.  Other: Scattered intraperitoneal free air predominantly around the liver. No ascites.  Musculoskeletal: Changes of laparoscopic bilateral inguinal hernia repair. No fracture or worrisome bone lesion.  Review of the MIP images confirms the above findings.  IMPRESSION: 1. Sigmoid diverticulitis without abscess. However, multiple bubbles of free intraperitoneal gas suggest uncontained perforation. Critical Value/emergent results were called by telephone at the time of interpretation on 09/05/2018 at 10am to Dr. Eber Hong MD, who verbally acknowledged these results. 2. Interval tube graft repair of ascending aorta and CABG without apparent complication. 3. Persistent dissection flap in the aortic arch through the abdominal aorta, extending into the right common carotid artery, with persistent mild compression of the true lumen in the mid descending segment. 4. 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 5. Decreased left renal parenchymal enhancement, which may be due to altered flow dynamics or restricted perfusion, as both left renal arteries are supplied by the false lumen of the aortic dissection. 6. Narrowing of the true lumen of the left common iliac artery secondary to distended false lumen, of possible hemodynamic significance. Correlate with ABIs.   Electronically Signed By: Corlis Leak M.D. On: 09/05/2018 11:03    I have independently reviewed the above radiology studies  and reviewed the findings with the patient.    Recent Lab Findings: Lab Results    Component Value Date   WBC 11.9 (H) 07/09/2018   HGB 13.0 07/09/2018   HCT 39.2 07/09/2018   PLT 635 (H) 07/09/2018   GLUCOSE 99 07/19/2018   CHOL 89 (L) 07/09/2018   TRIG 110 07/09/2018   HDL 36 (L) 07/09/2018   LDLCALC 31 07/09/2018   ALT 24 07/19/2018   AST 17 07/19/2018   NA 137 07/19/2018   K 4.6 07/19/2018   CL 99 07/19/2018   CREATININE 0.94 07/19/2018   BUN 8 07/19/2018   CO2 22 07/19/2018   TSH 3.840 06/27/2018   INR 0.95 06/20/2018   HGBA1C 4.9 06/25/2018      Assessment / Plan:       1/ I have again reviewed with the patient the diagnosis of his acute aortic dissection which has been repaired.  Cautioned him about lifting over 20 pounds for the next 3 months as his sternum heals.  But more importantly discussed with him the need to avoid strenuous lifting/Valsalva maneuver for the rest of his life because of his history of aortic dissection with his work.  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  2 enlarging skin lesion right upper arm-patient to contact local dermatologist to see about excision 3/patient complains of burning sensation on the bottoms of his feet consistent with neuropathy,  will obtain upper and lower extremity ABIs to fully document his circulation, on physical exam he has palpable pulses radial and posterior tibial bilaterally   Plan to see him back in 4      Delight Ovens MD      [address] [place] [address] Office [phone]   Beeper [phone]  10/04/2018 10:22 AM

## 2018-10-08 ENCOUNTER — Telehealth: Payer: Self-pay | Admitting: Cardiology

## 2018-10-08 MED ORDER — AMLODIPINE BESYLATE 5 MG PO TABS: 5.0000 mg | ORAL_TABLET | Freq: Every day | ORAL | 0 refills | Status: DC

## 2018-10-08 NOTE — Telephone Encounter (Signed)
°*  STAT* If patient is at the pharmacy, call can be transferred to refill team.   1. Which medications need to be refilled? (please list name of each medication and dose if known) Amlodipine 5mg  takes 1 daily  2. Which pharmacy/location (including street and city if local pharmacy) is medication to be sent to?[facility] on [address]  3. Do they need a 30 day or 90 day supply? 90

## 2018-10-08 NOTE — Telephone Encounter (Signed)
Refill for amlodipine sent to [facility] in [place] as requested.

## 2018-10-09 ENCOUNTER — Telehealth: Payer: Self-pay

## 2018-10-09 NOTE — Telephone Encounter (Signed)
Left message for patient to give the office a call back in regards to his ABI test results.  Will try patient back again later.

## 2018-10-10 ENCOUNTER — Encounter: Payer: Self-pay | Admitting: *Deleted

## 2018-10-29 DIAGNOSIS — Z736 Limitation of activities due to disability: Secondary | ICD-10-CM

## 2018-11-22 ENCOUNTER — Ambulatory Visit (INDEPENDENT_AMBULATORY_CARE_PROVIDER_SITE_OTHER): Payer: Medicaid Other | Admitting: Cardiothoracic Surgery

## 2018-11-22 VITALS — BP 128/74 | HR 57 | Resp 20 | Ht 74.0 in | Wt 239.0 lb

## 2018-11-22 DIAGNOSIS — Z95828 Presence of other vascular implants and grafts: Secondary | ICD-10-CM | POA: Diagnosis not present

## 2018-11-22 DIAGNOSIS — Z951 Presence of aortocoronary bypass graft: Secondary | ICD-10-CM | POA: Diagnosis not present

## 2018-11-22 DIAGNOSIS — Z952 Presence of prosthetic heart valve: Secondary | ICD-10-CM | POA: Diagnosis not present

## 2018-11-22 NOTE — Progress Notes (Signed)
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  SURGICAL PROCEDURE PERFORMED:  Repair of type 1 aortic dissection with resuspension of aortic valve, replacement of ascending aorta and hemiarch with a Hemashield 32 mm graft.  SURGICAL PROCEDURE:  Coronary artery bypass grafting x1 with reverse saphenous vein graft to the distal right coronary artery right thigh greater saphenous endoscopic vein harvest, hypothermic circulatory arrest with antegrade cerebral perfusion, right  axillary arterial cannulation.  SURGEON:  Sheliah Plane, MD  History of Present Illness:     Patient returns to the office today in follow-up after hisacute repair of type I aortic dissection with replacement of the ascending aorta resuspension of the aortic valve and coronary artery bypass grafting x1 to the right coronary artery.   patient was admitted to [facility] with abdominal pain , CTA of the chest at that time noted question of diverticulitis .the patient gives a history of being admitted for 5 days at [facility] treated for perforated bowel nonoperatively and ultimately discharged home .    ABIs were done after his last visit , with greater than 90% index in both legs .  Patient still complains of neuropathic burning in the soles of his feet.  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 Coronary artery disease   . Hypertension   .  Pressure injury of skin 06/21/2018  . 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 Years: 30.00  . Types: Cigarettes  . Last attempt to quit: 06/19/2018  . Years since quitting: 0.4  Smokeless Tobacco Never Used    Social History   Substance and Sexual Activity  Alcohol Use Not Currently     Allergies  Allergen Reactions  . Atorvastatin Other (See Comments)    fatigue  . Quinolones     Current Outpatient Medications  Medication Sig Dispense Refill  . acetaminophen (TYLENOL) 500 MG tablet Take 1 tablet (500 mg total) by mouth every 6 (six) hours as needed. 30 tablet 3  . amLODipine (NORVASC) 5 MG tablet Take 1 tablet (5 mg total) by mouth daily. 90 tablet 0  . famotidine-calcium carbonate-magnesium hydroxide (PEPCID COMPLETE) 10-800-165 MG chewable tablet Chew 1 tablet by mouth daily as needed.    . metoprolol tartrate (LOPRESSOR) 100 MG tablet Take 1 tablet (100 mg total) by mouth 2 (two) times daily. (Patient taking differently: Take 100 mg by mouth daily. ) 60 tablet 3  . pravastatin (PRAVACHOL) 20 MG tablet Take 1 tablet (20 mg total) by mouth every evening. 30 tablet 5   No current facility-administered medications for this visit.    Ankle-brachial indices were done October 08, 2018 at [facility] Right ABI 0.95 left ABI 0.90 biphasic posterior tibial and dorsalis pedis waveforms were noted bilaterally   Physical Exam:  BP 128/74   Pulse (!) 57   Resp 20   Ht 6\' 2"  (1.88 m)   Wt 239 lb (108.4 kg)   SpO2 96% Comment: RA  BMI 30.69 kg/m  General appearance: alert and cooperative Head: Normocephalic, without obvious abnormality, atraumatic Neck: no adenopathy, no carotid bruit, no JVD, supple, symmetrical, trachea midline and thyroid not enlarged, symmetric, no tenderness/mass/nodules Lymph nodes: Cervical, supraclavicular, and axillary nodes normal. Resp: clear to auscultation  bilaterally Back: symmetric, no curvature. ROM normal. No CVA tenderness. Cardio: regular rate and rhythm, S1, S2 normal, no murmur, click, rub or gallop GI: soft, non-tender; bowel sounds normal; no masses,  no organomegaly Extremities: extremities normal, atraumatic, no cyanosis or edema and Homans sign is negative, no sign of DVT Neurologic: Grossly normal I do not appreciate any aortic insufficiency on exam Skin lesion right upper arm has been resected small area of scar remains   Diagnostic Studies & Laboratory data:     Recent Radiology Findings:   CLINICAL DATA: Abdominal pain. Previous aortic surgery 06/19/2018  EXAM: CT ANGIOGRAPHY CHEST, ABDOMEN AND PELVIS  TECHNIQUE: Multidetector CT imaging through the chest, abdomen and pelvis was performed using the standard protocol during bolus administration of intravenous contrast. Multiplanar reconstructed images and MIPs were obtained and reviewed to evaluate the vascular anatomy.  CONTRAST: 100 mL Isovue 370 IV  COMPARISON: 06/19/2018 preop  FINDINGS: CTA CHEST FINDINGS  Cardiovascular: Changes of interval tube graft repair of the ascending aorta and CABG. Heart size normal. No pericardial effusion. Satisfactory opacification of pulmonary arteries noted, and there is no evidence of pulmonary emboli. Good contrast opacification of the thoracic aorta. Persistent dissection flap in the arch and descending thoracic segment with patency of true and false lumens, mild compression of the true lumen in the mid descending segment as was seen preop.  There is classic 3 vessel brachiocephalic arterial origin anatomy. The dissection flap extends into the right innominate artery and is seen in the proximal common carotid artery as before. Dilatation of proximal descending thoracic aortic segment 4.6 cm (previously 4.1 cm) tapering to 3 cm above the diaphragm.  Mediastinum/Nodes: Near complete resolution of mediastinal  hematoma. Patent vein graft to the right coronary artery. Calcified right hilar lymph node consistent with old granulomatous disease. No new hilar or mediastinal adenopathy.  Lungs/Pleura: No pleural effusion. No free air. Lungs are clear.  Musculoskeletal: Changes of median sternotomy. Anterior vertebral endplate spurring at multiple levels in the lower thoracic spine. Stable T10 compression deformity.  Review of the MIP images confirms the above findings.  CTA ABDOMEN AND PELVIS FINDINGS  VASCULAR  Aorta: Dissection flap extends through the abdominal aorta with improved flow in the true lumen and continued patency of the false lumen. No aneurysm.  Celiac: Patent, supplied by true lumen  SMA: Patent, supplied by true lumen  Renals: There are 2 left renal arteries, inferior dominant, both supplied by the false lumen.  Single right renal artery, supplied by true lumen, widely patent.  IMA: Patent, supplied by true lumen  Inflow: The dissection flap extends into the proximal common iliac arteries. On the left, there is some narrowing of the true lumen secondary to distended false lumen, and incomplete opacification of the false lumen which may simply be due to early scan timing. On the right, the true lumen is widely patent.  External iliac arteries and common femoral arteries are widely patent bilaterally.  Veins: No obvious venous abnormality within the limitations of this arterial phase study.  Review of the MIP images confirms the above findings.  NON-VASCULAR  Hepatobiliary: No focal liver abnormality is seen. No gallstones, gallbladder wall thickening, or biliary dilatation.  Pancreas: Unremarkable. No pancreatic ductal dilatation or surrounding inflammatory changes.  Spleen: Normal in size without focal abnormality.  Adrenals/Urinary Tract: Normal adrenals.  Stable right renal cysts. No hydronephrosis.  Globally decreased enhancement of left renal  parenchyma probably related to slower flow in the false lumen of the aortic dissection which supplies both of the left renal arteries.  Urinary bladder incompletely distended  Stomach/Bowel: Stomach is incompletely distended. The small bowel is decompressed. Normal appendix. The colon is nondilated. Multiple sigmoid diverticula. New wall thickening and inflammatory/edematous changes involving mid sigmoid colon. No discrete abscess.  Lymphatic: No abdominal or pelvic adenopathy.  Reproductive: Mild prostatic enlargement.  Other: Scattered intraperitoneal free air predominantly around the liver. No ascites.  Musculoskeletal: Changes of laparoscopic bilateral inguinal hernia repair. No fracture or worrisome bone lesion.  Review of the MIP images confirms the above findings.  IMPRESSION: 1. Sigmoid diverticulitis without abscess. However, multiple bubbles of free intraperitoneal gas suggest uncontained perforation. Critical Value/emergent results were called by telephone at the time of interpretation on 09/05/2018 at 10am to Dr. Eber Hong MD, who verbally acknowledged these results. 2. Interval tube graft repair of ascending aorta and CABG without apparent complication. 3. Persistent dissection flap in the aortic arch through the abdominal aorta, extending into the right common carotid artery, with persistent mild compression of the true lumen in the mid descending segment. 4. 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 5. Decreased left renal parenchymal enhancement, which may be due to altered flow dynamics or restricted perfusion, as both left renal arteries are supplied by the false lumen of the aortic dissection. 6. Narrowing of the true lumen of the left common iliac artery secondary to distended false lumen, of possible hemodynamic significance. Correlate with ABIs.   Electronically Signed By: Corlis Leak M.D. On:  09/05/2018 11:03    I have independently reviewed the above radiology studies  and reviewed the findings with the patient.    Recent Lab Findings: Lab Results  Component Value Date   WBC 11.9 (H) 07/09/2018   HGB 13.0 07/09/2018   HCT 39.2 07/09/2018   PLT 635 (H) 07/09/2018   GLUCOSE 99 07/19/2018   CHOL 89 (L) 07/09/2018   TRIG 110 07/09/2018   HDL 36 (L) 07/09/2018   LDLCALC 31 07/09/2018   ALT 24 07/19/2018   AST 17 07/19/2018   NA 137 07/19/2018   K 4.6 07/19/2018   CL 99 07/19/2018   CREATININE 0.94 07/19/2018   BUN 8 07/19/2018   CO2 22 07/19/2018   TSH 3.840 06/27/2018   INR 0.95 06/20/2018   HGBA1C 4.9 06/25/2018      Assessment / Plan:   Follow-up CTA was done 12/18 2019  when the patient presented to the emergency room at [facility] Blood pressure under good control Patient still plagued by problems with obtaining Medicaid insurance Plan to see him back in June 2020 with a follow-up CTA, which will be approximately 6 months after his previous       Delight Ovens MD      [address] [place] [place] [address] Office [phone]   Beeper [phone]  11/22/2018 9:56 AM

## 2018-12-10 DIAGNOSIS — Z736 Limitation of activities due to disability: Secondary | ICD-10-CM

## 2018-12-19 ENCOUNTER — Other Ambulatory Visit: Payer: Self-pay | Admitting: Cardiothoracic Surgery

## 2018-12-19 DIAGNOSIS — Z95828 Presence of other vascular implants and grafts: Secondary | ICD-10-CM

## 2018-12-19 DIAGNOSIS — I7101 Dissection of thoracic aorta: Secondary | ICD-10-CM

## 2018-12-19 DIAGNOSIS — I71019 Dissection of thoracic aorta, unspecified: Secondary | ICD-10-CM

## 2018-12-19 NOTE — Progress Notes (Signed)
ct 

## 2019-01-06 ENCOUNTER — Other Ambulatory Visit: Payer: Self-pay | Admitting: Cardiology

## 2019-01-07 NOTE — Telephone Encounter (Signed)
Amlodipine sent to [facility] in [place]

## 2019-03-04 NOTE — Progress Notes (Signed)
Virtual Visit via Telephone Note   This visit type was conducted due to national recommendations for restrictions regarding the COVID-19 Pandemic (e.g. social distancing) in an effort to limit this patient's exposure and mitigate transmission in our community.  Due to his co-morbid illnesses, this patient is at least at moderate risk for complications without adequate follow up.  This format is felt to be most appropriate for this patient at this time.  The patient did not have access to video technology/had technical difficulties with video requiring transitioning to audio format only (telephone).  All issues noted in this document were discussed and addressed.  No physical exam could be performed with this format.  Please refer to the patient's chart for his  consent to telehealth for [facility].   Date:  03/05/2019   ID:  Janelle Floor, DOB [birth year], MRN [id number]  Patient Location: Home Provider Location: Office  PCP:  Patient, No Pcp Per  Cardiologist:  Shirlee More, MD  Electrophysiologist:  None   Evaluation Performed:  Follow-Up Visit  Chief Complaint:  Elevated BP  History of Present Illness:    Timothy West is a 58 y.o. male with  a hx of type A thoracic aortic dissection with replacement of ascending aorta resuspension of the aortic valve and saphenous vein bypass graft right coronary artery 06/20/2018 emergency surgery  last seen 08/19/18.  The patient does not have symptoms concerning for COVID-19 infection (fever, chills, cough, or new shortness of breath).   He is not a good place in life he was refused for disability started smoking and really is not very much interested in his health at this time he stopped taking a statin because of cost.  He is using a wrist blood pressure cuff and consistently get numbers of 1 08-6 70 systolic.  He is unable to control dietary sodium is eating fast food.  No chest pain shortness of breath palpitation or syncope but is having  headache that sounds like migraine unilateral visual blurring.  After discussing goals of treatment he agrees to take an additional antihypertensive drug I will place him on an ARB diuretic combination I will ask him to come to my office in 2 weeks for BMP and see him in the office afterwards.  I will approach him but ultimately lipid-lowering treatment at that time.  I am very concerned of poor prognosis with uncontrolled hypertension discontinuation of medications and going back to cigarette smoking.  Past Medical History:  Diagnosis Date   Blurry vision, left eye    Coronary artery disease    Headache    Hypertension    Pressure injury of skin 06/21/2018   S/P ascending aortic replacement 06/20/2018   Past Surgical History:  Procedure Laterality Date   CORONARY ARTERY BYPASS GRAFT N/A 06/19/2018   Procedure: CORONARY ARTERY BYPASS GRAFTING (CABG) x1 WITH ENDOSCOPIC VEIN HARVEST (RIGHT THIGH). SVG TO THE RIGHT CORONARY ARTERY.;  Surgeon: Grace Isaac, MD;  Location: [place];  Service: Open Heart Surgery;  Laterality: N/A;   INTRAOPERATIVE TRANSESOPHAGEAL ECHOCARDIOGRAM  06/19/2018   Procedure: INTRAOPERATIVE TRANSESOPHAGEAL ECHOCARDIOGRAM;  Surgeon: Grace Isaac, MD;  Location: [facility] OR;  Service: Open Heart Surgery;;   REPLACEMENT ASCENDING AORTA N/A 06/19/2018   Procedure: REPAIR OF ACUTE TYPE I ASCENDING AORTIC DISSECTION. REPLACEMENT OF AORTA (HEMI ARCH) WITH  32MM STRAIGHT HEMASHIELD PLATINUM GRAFT. RESUSPENSION OF AORTIC VALVE. HYPOTHERMIC CIRCULATORY ARREST WITH ANTEGRADE CEREBRAL PERFUSION.;  Surgeon: Grace Isaac, MD;  Location: [place];  Service: Open  Heart Surgery;  Laterality: N/A;     Current Meds  Medication Sig   acetaminophen (TYLENOL) 500 MG tablet Take 1 tablet (500 mg total) by mouth every 6 (six) hours as needed.   amLODipine (NORVASC) 5 MG tablet Take 1 tablet by mouth once daily   famotidine-calcium carbonate-magnesium hydroxide (PEPCID COMPLETE)  10-800-165 MG chewable tablet Chew 1 tablet by mouth daily as needed.   metoprolol tartrate (LOPRESSOR) 100 MG tablet Take 100 mg by mouth daily.   naproxen sodium (ALEVE) 220 MG tablet Take 220 mg by mouth daily as needed.     Allergies:   Atorvastatin and Quinolones   Social History   Tobacco Use   Smoking status: Current Every Day Smoker    Packs/day: 2.00    Years: 30.00    Pack years: 60.00    Types: Cigarettes    Last attempt to quit: 06/19/2018    Years since quitting: 0.7   Smokeless tobacco: Never Used  Substance Use Topics   Alcohol use: Not Currently   Drug use: Not Currently     Family Hx: The patient's family history includes Hypertension in his brother and mother; Thyroid disease in his mother.  ROS:   Please see the history of present illness.     All other systems reviewed and are negative.   Prior CV studies:   The following studies were reviewed today:    Labs/Other Tests and Data Reviewed:    EKG:  No ECG reviewed.  Recent Labs: 06/21/2018: Magnesium 1.9 06/27/2018: TSH 3.840 07/09/2018: Hemoglobin 13.0; Platelets 635 07/19/2018: ALT 24; BUN 8; Creatinine, Ser 0.94; Potassium 4.6; Sodium 137   Recent Lipid Panel Lab Results  Component Value Date/Time   CHOL 89 (L) 07/09/2018 11:00 AM   TRIG 110 07/09/2018 11:00 AM   HDL 36 (L) 07/09/2018 11:00 AM   CHOLHDL 2.5 07/09/2018 11:00 AM   CHOLHDL 4.5 06/26/2018 05:04 AM   LDLCALC 31 07/09/2018 11:00 AM    Wt Readings from Last 3 Encounters:  03/05/19 240 lb (108.9 kg)  11/22/18 239 lb (108.4 kg)  10/04/18 232 lb 6.4 oz (105.4 kg)     Objective:    Vital Signs:  BP (!) 151/70 (BP Location: Left Arm, Patient Position: Sitting)    Pulse (!) 104    Ht 6\' 2"  (1.88 m)    Wt 240 lb (108.9 kg)    BMI 30.81 kg/m    VITAL SIGNS:  reviewed his   ASSESSMENT & PLAN:    1. Status post ascending aortic repair for type a dissection stable very important to address his risk factors will initiate  with antihypertension's approach in about lipid-lowering in the office and hopefully be able to encourage him to stop smoking again 2. Hypertension poorly controlled add ARB diuretic combination 3. Hyperlipidemia poorly controlled stopped his medications will discuss alternatives and trying to choose the least expensive medications from [facility] at his office follow-up 4. CAD status post saphenous vein graft bypass at the time of repair of his aortic dissection stable we need blood pressure control and he needs to reinitiate lipid-lowering treatment  COVID-19 Education: The signs and symptoms of COVID-19 were discussed with the patient and how to seek care for testing (follow up with PCP or arrange E-visit).  The importance of social distancing was discussed today.  Time:   Today, I have spent 20 minutes with the patient with telehealth technology discussing the above problems.     Medication Adjustments/Labs and Tests Ordered:  Current medicines are reviewed at length with the patient today.  Concerns regarding medicines are outlined above.   Tests Ordered: No orders of the defined types were placed in this encounter.   Medication Changes: No orders of the defined types were placed in this encounter.   Follow Up:  In Person in 2 week(s)  Signed, Norman HerrlichBrian Ivin Rosenbloom, MD  03/05/2019 2:39 PM     [facility]

## 2019-03-05 ENCOUNTER — Encounter: Payer: Self-pay | Admitting: Cardiology

## 2019-03-05 ENCOUNTER — Other Ambulatory Visit: Payer: Self-pay

## 2019-03-05 ENCOUNTER — Telehealth (INDEPENDENT_AMBULATORY_CARE_PROVIDER_SITE_OTHER): Payer: Medicaid Other | Admitting: Cardiology

## 2019-03-05 VITALS — BP 151/70 | HR 104 | Ht 74.0 in | Wt 240.0 lb

## 2019-03-05 DIAGNOSIS — I119 Hypertensive heart disease without heart failure: Secondary | ICD-10-CM

## 2019-03-05 DIAGNOSIS — Z95828 Presence of other vascular implants and grafts: Secondary | ICD-10-CM | POA: Diagnosis not present

## 2019-03-05 DIAGNOSIS — I2510 Atherosclerotic heart disease of native coronary artery without angina pectoris: Secondary | ICD-10-CM

## 2019-03-05 DIAGNOSIS — E785 Hyperlipidemia, unspecified: Secondary | ICD-10-CM

## 2019-03-05 MED ORDER — LOSARTAN POTASSIUM-HCTZ 100-12.5 MG PO TABS: 1.0000 | ORAL_TABLET | Freq: Every day | ORAL | 1 refills | Status: DC

## 2019-03-05 NOTE — Patient Instructions (Signed)
Medication Instructions:  Your physician has recommended you make the following change in your medication:   START: Losartan/HCTZ 10012.5 mg 1 tab daily  If you need a refill on your cardiac medications before your next appointment, please call your pharmacy.   Lab work: Your physician recommends that you return for lab work in: 2 weeks BMP  If you have labs (blood work) drawn today and your tests are completely normal, you will receive your results only by: Marland Kitchen MyChart Message (if you have MyChart) OR . A paper copy in the mail If you have any lab test that is abnormal or we need to change your treatment, we will call you to review the results.  Testing/Procedures: None  Follow-Up: At [facility], you and your health needs are our priority.  As part of our continuing mission to provide you with exceptional heart care, we have created designated Provider Care Teams.  These Care Teams include your primary Cardiologist (physician) and Advanced Practice Providers (APPs -  Physician Assistants and Nurse Practitioners) who all work together to provide you with the care you need, when you need it. You will need a follow up appointment in 3 weeks.   Any Other Special Instructions Will Be Listed Below (If Applicable).  Hydrochlorothiazide, HCTZ; Losartan tablets What is this medicine? LOSARTAN; HYDROCHLOROTHIAZIDE (loe SAR tan; hye droe klor oh THYE a zide) is a combination of a drug that relaxes blood vessels and a diuretic. It is used to treat high blood pressure. This medicine may also reduce the risk of stroke in certain patients. This medicine may be used for other purposes; ask your health care provider or pharmacist if you have questions. COMMON BRAND NAME(S): Hyzaar What should I tell my health care provider before I take this medicine? They need to know if you have any of these conditions: -decreased urine -kidney disease -liver disease -if you are on a special diet, like a  low-salt diet -immune system problems, like lupus -an unusual or allergic reaction to losartan, hydrochlorothiazide, sulfa drugs, other medicines, foods, dyes, or preservatives -pregnant or trying to get pregnant -breast-feeding How should I use this medicine? Take this medicine by mouth with a glass of water. Follow the directions on the prescription label. You can take it with or without food. If it upsets your stomach, take it with food. Take your medicine at regular intervals. Do not take it more often than directed. Do not stop taking except on your doctor's advice. Talk to your pediatrician regarding the use of this medicine in children. Special care may be needed. Overdosage: If you think you have taken too much of this medicine contact a poison control center or emergency room at once. NOTE: This medicine is only for you. Do not share this medicine with others. What if I miss a dose? If you miss a dose, take it as soon as you can. If it is almost time for your next dose, take only that dose. Do not take double or extra doses. What may interact with this medicine? -barbiturates, like phenobarbital -blood pressure medicines -celecoxib -cimetidine -corticosteroids -diabetic medicines -diuretics, especially triamterene, spironolactone or amiloride -fluconazole -lithium -NSAIDs, medicines for pain and inflammation, like ibuprofen or naproxen -potassium salts or potassium supplements -prescription pain medicines -rifampin -skeletal muscle relaxants like tubocurarine -some cholesterol-lowering medicines like cholestyramine or colestipol This list may not describe all possible interactions. Give your health care provider a list of all the medicines, herbs, non-prescription drugs, or dietary supplements you use.  Also tell them if you smoke, drink alcohol, or use illegal drugs. Some items may interact with your medicine. What should I watch for while using this medicine? Check your blood  pressure regularly while you are taking this medicine. Ask your doctor or health care professional what your blood pressure should be and when you should contact him or her. When you check your blood pressure, write down the measurements to show your doctor or health care professional. If you are taking this medicine for a long time, you must visit your health care professional for regular checks on your progress. Make sure you schedule appointments on a regular basis. You must not get dehydrated. Ask your doctor or health care professional how much fluid you need to drink a day. Check with him or her if you get an attack of severe diarrhea, nausea and vomiting, or if you sweat a lot. The loss of too much body fluid can make it dangerous for you to take this medicine. Women should inform their doctor if they wish to become pregnant or think they might be pregnant. There is a potential for serious side effects to an unborn child, particularly in the second or third trimester. Talk to your health care professional or pharmacist for more information. You may get drowsy or dizzy. Do not drive, use machinery, or do anything that needs mental alertness until you know how this drug affects you. Do not stand or sit up quickly, especially if you are an older patient. This reduces the risk of dizzy or fainting spells. Alcohol can make you more drowsy and dizzy. Avoid alcoholic drinks. This medicine may affect your blood sugar level. If you have diabetes, check with your doctor or health care professional before changing the dose of your diabetic medicine. Avoid salt substitutes unless you are told otherwise by your doctor or health care professional. Do not treat yourself for coughs, colds, or pain while you are taking this medicine without asking your doctor or health care professional for advice. Some ingredients may increase your blood pressure. What side effects may I notice from receiving this medicine? Side  effects that you should report to your doctor or health care professional as soon as possible: -allergic reactions like skin rash, itching or hives, swelling of the face, lips, or tongue -breathing problems -changes in vision -dark urine -eye pain -fast or irregular heart beat, palpitations, or chest pain -feeling faint or lightheaded -muscle cramps -persistent dry cough -redness, blistering, peeling or loosening of the skin, including inside the mouth -stomach pain -trouble passing urine or change in the amount of urine -unusual bleeding or bruising -worsened gout pain -yellowing of the eyes or skin Side effects that usually do not require medical attention (report to your doctor or health care professional if they continue or are bothersome): -change in sex drive or performance -headache This list may not describe all possible side effects. Call your doctor for medical advice about side effects. You may report side effects to FDA at 1-800-FDA-1088. Where should I keep my medicine? Keep out of the reach of children. Store at room temperature between 15 and 30 degrees C (59 and 86 degrees F). Protect from light. Keep container tightly closed. Throw away any unused medicine after the expiration date. NOTE: This sheet is a summary. It may not cover all possible information. If you have questions about this medicine, talk to your doctor, pharmacist, or health care provider.  2019 Elsevier/Gold Standard (2010-05-26 13:57:32)

## 2019-03-05 NOTE — Addendum Note (Signed)
Addended by: Particia Nearing B on: 03/05/2019 02:55 PM   Modules accepted: Orders

## 2019-03-13 ENCOUNTER — Other Ambulatory Visit: Payer: Self-pay

## 2019-03-14 ENCOUNTER — Ambulatory Visit: Payer: Self-pay | Admitting: Cardiothoracic Surgery

## 2019-03-14 ENCOUNTER — Other Ambulatory Visit: Payer: Self-pay

## 2019-03-28 ENCOUNTER — Ambulatory Visit: Payer: Self-pay | Admitting: Cardiology

## 2019-04-09 ENCOUNTER — Other Ambulatory Visit: Payer: Self-pay | Admitting: Cardiology

## 2019-04-09 ENCOUNTER — Other Ambulatory Visit: Payer: Self-pay | Admitting: Family Medicine

## 2019-04-09 NOTE — Telephone Encounter (Signed)
Rx refill sent to pharmacy. 

## 2019-04-24 DIAGNOSIS — Z736 Limitation of activities due to disability: Secondary | ICD-10-CM

## 2019-05-13 ENCOUNTER — Telehealth: Payer: Self-pay | Admitting: Cardiology

## 2019-05-13 NOTE — Telephone Encounter (Signed)
°*  STAT* If patient is at the pharmacy, call can be transferred to refill team.   1. Which medications need to be refilled? (please list name of each medication and dose if known) Amlodipine 5mg  takes 1 daily   2. Which pharmacy/location (including street and city if local pharmacy) is medication to be sent to? [facility] [place]   3. Do they need a 30 day or 90 day supply? 90  Please call in 3 mo supply for insurance purpose

## 2019-05-14 ENCOUNTER — Other Ambulatory Visit: Payer: Self-pay | Admitting: *Deleted

## 2019-05-14 MED ORDER — AMLODIPINE BESYLATE 5 MG PO TABS: 5.0000 mg | ORAL_TABLET | Freq: Every day | ORAL | 0 refills | Status: DC

## 2019-05-14 NOTE — Telephone Encounter (Signed)
Refill sent.

## 2019-07-21 NOTE — Progress Notes (Signed)
Cardiology Office Note:    Date:  07/23/2019   ID:  Timothy West, DOB 20-May-1961, MRN [id number]  PCP:  Patient, No Pcp Per  Cardiologist:  Grantland Want, MD    Referring MD: No ref. provider found    ASSESSMENT:    1. Hypertensive heart disease without heart failure   2. S/P ascending aortic replacement   3. CAD in native artery   4. Dyslipidemia    PLAN:    In order of problems listed above:  1. Proved BP at target continue combination treatment with ARB diuretic calcium channel blocker. 2. Stable check echocardiogram regarding aortic valve function 3. Stable resumed aspirin 81 mg daily 4. Initiate pravastatin check baseline labs and we could do follow-ups at his next visit in 6 months   Next appointment: 6   Medication Adjustments/Labs and Tests Ordered: Current medicines are reviewed at length with the patient today.  Concerns regarding medicines are outlined above.  No orders of the defined types were placed in this encounter.  No orders of the defined types were placed in this encounter.   Chief Complaint  Patient presents with  . Follow-up  . Hypertension    History of Present Illness:    Timothy West is a 58 y.o. 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 He was last seen 03/05/2019 a virtual visit.  Unfortunately he has resumed cigarette smoking quite distressed by the disability process and was having poorly controlled hypertension systolics 160 to 170 mmHg and was placed on additional antihypertensive therapy with an  ARB diuretic combination.  Chart review show the previously was taking aspirin and taking a statin neither which my on his medication list. Compliance with diet, lifestyle and medications: Yes  He fell today was seen at [facility] ED and was discharged just prior to coming  to my office.  From cardiac perspective he is doing well blood pressures at target no chest pain shortness of breath palpitation or syncope but he is frustrated having both significant weight gain and inability to stop smoking.  I reviewed with him the necessity of lipid-lowering treatment he agrees to try again placement pravastatin he has had no labs done we will check CMP liver function baseline lipids and echocardiogram with his valve intervention a year ago.  I strongly encouraged him to go ahead and have the CT angiogram ordered by his thoracic surgeon but he prefers to call the office and be seen first.  Past Medical History:  Diagnosis Date  . Blurry vision, left eye   . Coronary artery disease   . Headache   . Hypertension   . Pressure injury of skin 06/21/2018  . S/P ascending aortic replacement 06/20/2018    Past Surgical History:  Procedure Laterality Date  . CORONARY ARTERY BYPASS GRAFT N/A 06/19/2018   Procedure: CORONARY ARTERY BYPASS GRAFTING (CABG) x1 WITH ENDOSCOPIC VEIN HARVEST (RIGHT THIGH). SVG TO THE RIGHT CORONARY ARTERY.;  Surgeon: Gerhardt, Edward B, MD;  Location: [facility] OR;  Service: Open Heart Surgery;  Laterality: N/A;  . INTRAOPERATIVE TRANSESOPHAGEAL ECHOCARDIOGRAM  06/19/2018   Procedure: INTRAOPERATIVE TRANSESOPHAGEAL ECHOCARDIOGRAM;  Surgeon: Gerhardt, Edward B, MD;  Location: [facility] OR;  Service: Open Heart Surgery;;  . REPLACEMENT ASCENDING AORTA N/A 06/19/2018   Procedure: REPAIR OF ACUTE TYPE I ASCENDING AORTIC DISSECTION. REPLACEMENT OF AORTA (HEMI ARCH) WITH  <MEASUREMEElliot [email or web address].<MEASUREMENTElliot Gurn91.<MEASUREMENTElliot Gurn91.<MEASUREMENTElliot Gurn91.<MEASUREMENTElliot Gurn91.<MEASUREMENTElliot Gurn91.<MEASUREMENTElliot Gurn91.<MEASUREMENTElliot Gurn91.<MEASUREMENTElliot Gurn91.<MEASUREMENTElliot Gurn91.4y> STRAIGHT HEMASHIELD  PLATINUM GRAFT. RESUSPENSION OF AORTIC VALVE. HYPOTHERMIC CIRCULATORY ARREST WITH ANTEGRADE CEREBRAL PERFUSION.;  Surgeon: Delight Ovens, MD;  Location: [facility] OR;  Service: Open Heart Surgery;  Laterality: N/A;    Current Medications: Current Meds  Medication Sig  . amLODipine (NORVASC) 5 MG tablet Take 1 tablet (5 mg total) by mouth daily.  . famotidine-calcium carbonate-magnesium  hydroxide (PEPCID COMPLETE) 10-800-165 MG chewable tablet Chew 1 tablet by mouth daily as needed.  Marland Kitchen losartan-hydrochlorothiazide (HYZAAR) 100-12.5 MG tablet Take 1 tablet by mouth daily.  . naproxen sodium (ALEVE) 220 MG tablet Take 220 mg by mouth daily as needed.     Allergies:   Atorvastatin and Quinolones   Social History   Socioeconomic History  . Marital status: Single    Spouse name: Not on file  . Number of children: Not on file  . Years of education: Not on file  . Highest education level: Not on file  Occupational History  . Not on file  Social Needs  . Financial resource strain: Not on file  . Food insecurity    Worry: Not on file    Inability: Not on file  . Transportation needs    Medical: Not on file    Non-medical: Not on file  Tobacco Use  . Smoking status: Current Every Day Smoker    Packs/day: 2.00    Years: 30.00    Pack years: 60.00    Types: Cigarettes  . Smokeless tobacco: Never Used  Substance and Sexual Activity  . Alcohol use: Yes  . Drug use: Not Currently  . Sexual activity: Yes  Lifestyle  . Physical activity    Days per week: Not on file    Minutes per session: Not on file  . Stress: Not on file  Relationships  . Social Musician on phone: Not on file    Gets together: Not on file    Attends religious service: Not on file    Active member of club or organization: Not on file    Attends meetings of clubs or organizations: Not on file    Relationship status: Not on file  Other Topics Concern  . Not on file  Social History Narrative  . Not on file     Family History: The patient's family history includes Hypertension in his brother and mother; Thyroid disease in his mother. ROS:   Please see the history of present illness.    All other systems reviewed and are negative.  EKGs/Labs/Other Studies Reviewed:    The following studies were reviewed today:  EKG:  EKG ordered today and personally reviewed.  The ekg ordered  today demonstrates sinus rhythm it is normal  Recent Labs: No results found for requested labs within last 8760 hours.  Recent Lipid Panel    Component Value Date/Time   CHOL 89 (L) 07/09/2018 1100   TRIG 110 07/09/2018 1100   HDL 36 (L) 07/09/2018 1100   CHOLHDL 2.5 07/09/2018 1100   CHOLHDL 4.5 06/26/2018 0504   VLDL 17 06/26/2018 0504   LDLCALC 31 07/09/2018 1100    Physical Exam:    VS:  BP 138/84 (BP Location: Left Arm, Patient Position: Sitting, Cuff Size: Large)   Pulse 91   Ht 6\' 2"  (1.88 m)   Wt 233 lb 12.8 oz (106.1 kg)   SpO2 98%   BMI 30.02 kg/m     Wt Readings from Last 3 Encounters:  07/23/19 233 lb 12.8 oz (106.1 kg)  03/05/19 240 lb (108.9 kg)  11/22/18 239 lb (108.4 kg)     GEN:  Well nourished, well developed in no acute distress HEENT: Normal NECK: No JVD; No carotid bruits LYMPHATICS: No lymphadenopathy CARDIAC: RRR, no murmurs, rubs, gallops RESPIRATORY:  Clear to auscultation without rales, wheezing or rhonchi  ABDOMEN: Soft, non-tender, non-distended MUSCULOSKELETAL:  No edema; No deformity  SKIN: Warm and dry NEUROLOGIC:  Alert and oriented x 3 PSYCHIATRIC:  Normal affect    Signed, Shirlee More, MD  07/23/2019 1:26 PM    [facility]

## 2019-07-23 ENCOUNTER — Other Ambulatory Visit: Payer: Self-pay

## 2019-07-23 ENCOUNTER — Encounter: Payer: Self-pay | Admitting: Cardiology

## 2019-07-23 ENCOUNTER — Ambulatory Visit (INDEPENDENT_AMBULATORY_CARE_PROVIDER_SITE_OTHER): Payer: Medicaid Other | Admitting: Cardiology

## 2019-07-23 VITALS — BP 138/84 | HR 91 | Ht 74.0 in | Wt 233.8 lb

## 2019-07-23 DIAGNOSIS — I119 Hypertensive heart disease without heart failure: Secondary | ICD-10-CM

## 2019-07-23 DIAGNOSIS — Z95828 Presence of other vascular implants and grafts: Secondary | ICD-10-CM | POA: Diagnosis not present

## 2019-07-23 DIAGNOSIS — E785 Hyperlipidemia, unspecified: Secondary | ICD-10-CM

## 2019-07-23 DIAGNOSIS — I2510 Atherosclerotic heart disease of native coronary artery without angina pectoris: Secondary | ICD-10-CM | POA: Diagnosis not present

## 2019-07-23 MED ORDER — PRAVASTATIN SODIUM 20 MG PO TABS: 20.0000 mg | ORAL_TABLET | Freq: Every evening | ORAL | 6 refills | Status: DC

## 2019-07-23 NOTE — Patient Instructions (Signed)
Medication Instructions:  Your physician has recommended you make the following change in your medication:   START: pravastatin 20mg  one tablet daily.  *If you need a refill on your cardiac medications before your next appointment, please call your pharmacy*  Lab Work: You will have lab work today: CMP and Lipid If you have labs (blood work) drawn today and your tests are completely normal, you will receive your results only by: MyChart Message (if you have MyChart) OR . A paper copy in the mail If you have any lab test that is abnormal or we need to change your treatment, we will call you to review the results.  Testing/Procedures: You had an EKG today.  Your physician has requested that you have an echocardiogram. Echocardiography is a painless test that uses sound waves to create images of your heart. It provides your doctor with information about the size and shape of your heart and how well your heart's chambers and valves are working. This procedure takes approximately one hour. There are no restrictions for this procedure.    Follow-Up: At [facility], you and your health needs are our priority.  As part of our continuing mission to provide you with exceptional heart care, we have created designated Provider Care Teams.  These Care Teams include your primary Cardiologist (physician) and Advanced Practice Providers (APPs -  Physician Assistants and Nurse Practitioners) who all work together to provide you with the care you need, when you need it.  Your next appointment:   6 months  The format for your next appointment:   In Person  Provider:   [facility] MD   Echocardiogram An echocardiogram is a procedure that uses painless sound waves (ultrasound) to produce an image of the heart. Images from an echocardiogram can provide important information about:  Signs of coronary artery disease (CAD).  Aneurysm detection. An aneurysm is a weak or damaged part of an artery  wall that bulges out from the normal force of blood pumping through the body.  Heart size and shape. Changes in the size or shape of the heart can be associated with certain conditions, including heart failure, aneurysm, and CAD.  Heart muscle function.  Heart valve function.  Signs of a past heart attack.  Fluid buildup around the heart.  Thickening of the heart muscle.  A tumor or infectious growth around the heart valves. Tell a health care provider about:  Any allergies you have.  All medicines you are taking, including vitamins, herbs, eye drops, creams, and over-the-counter medicines.  Any blood disorders you have.  Any surgeries you have had.  Any medical conditions you have.  Whether you are pregnant or may be pregnant. What are the risks? Generally, this is a safe procedure. However, problems may occur, including:  Allergic reaction to dye (contrast) that may be used during the procedure. What happens before the procedure? No specific preparation is needed. You may eat and drink normally. What happens during the procedure?   An IV tube may be inserted into one of your veins.  You may receive contrast through this tube. A contrast is an injection that improves the quality of the pictures from your heart.  A gel will be applied to your chest.  A wand-like tool (transducer) will be moved over your chest. The gel will help to transmit the sound waves from the transducer.  The sound waves will harmlessly bounce off of your heart to allow the heart images to be captured in  real-time motion. The images will be recorded on a computer. The procedure may vary among health care providers and hospitals. What happens after the procedure?  You may return to your normal, everyday life, including diet, activities, and medicines, unless your health care provider tells you not to do that. Summary  An echocardiogram is a procedure that uses painless sound waves (ultrasound)  to produce an image of the heart.  Images from an echocardiogram can provide important information about the size and shape of your heart, heart muscle function, heart valve function, and fluid buildup around your heart.  You do not need to do anything to prepare before this procedure. You may eat and drink normally.  After the echocardiogram is completed, you may return to your normal, everyday life, unless your health care provider tells you not to do that. This information is not intended to replace advice given to you by your health care provider. Make sure you discuss any questions you have with your health care provider. Document Released: 09/02/2000 Document Revised: 12/27/2018 Document Reviewed: 10/08/2016 Elsevier Patient Education  [place].   Pravastatin tablets What is this medicine? PRAVASTATIN (PRA va stat in) is known as a HMG-CoA reductase inhibitor or 'statin'. It lowers the level of cholesterol and triglycerides in the blood. This drug may also reduce the risk of heart attack, stroke, or other health problems in patients with risk factors for heart disease. Diet and lifestyle changes are often used with this drug. This medicine may be used for other purposes; ask your health care provider or pharmacist if you have questions. COMMON BRAND NAME(S): Pravachol What should I tell my health care provider before I take this medicine? They need to know if you have any of these conditions:  diabetes  if you often drink alcohol  history of stroke  kidney disease  liver disease  muscle aches or weakness  thyroid disease  an unusual or allergic reaction to pravastatin, other medicines, foods, dyes, or preservatives  pregnant or trying to get pregnant  breast-feeding How should I use this medicine? Take pravastatin tablets by mouth. Swallow the tablets with a drink of water. Pravastatin can be taken at anytime of the day, with or without food. Follow the  directions on the prescription label. Take your doses at regular intervals. Do not take your medicine more often than directed. Talk to your pediatrician regarding the use of this medicine in children. Special care may be needed. Pravastatin has been used in children as young as 37 years of age. Overdosage: If you think you have taken too much of this medicine contact a poison control center or emergency room at once. NOTE: This medicine is only for you. Do not share this medicine with others. What if I miss a dose? If you miss a dose, take it as soon as you can. If it is almost time for your next dose, take only that dose. Do not take double or extra doses. What may interact with this medicine? This medicine may interact with the following medications:  colchicine  cyclosporine  other medicines for high cholesterol  some antibiotics like azithromycin, clarithromycin, erythromycin, and telithromycin This list may not describe all possible interactions. Give your health care provider a list of all the medicines, herbs, non-prescription drugs, or dietary supplements you use. Also tell them if you smoke, drink alcohol, or use illegal drugs. Some items may interact with your medicine. What should I watch for while using this medicine? Visit your  doctor or health care professional for regular check-ups. You may need regular tests to make sure your liver is working properly. Your health care professional may tell you to stop taking this medicine if you develop muscle problems. If your muscle problems do not go away after stopping this medicine, contact your health care professional. Do not become pregnant while taking this medicine. Women should inform their health care professional if they wish to become pregnant or think they might be pregnant. There is a potential for serious side effects to an unborn child. Talk to your health care professional or pharmacist for more information. Do not breast-feed  an infant while taking this medicine. This medicine may affect blood sugar levels. If you have diabetes, check with your doctor or health care professional before you change your diet or the dose of your diabetic medicine. If you are going to need surgery or other procedure, tell your doctor that you are using this medicine. This drug is only part of a total heart-health program. Your doctor or a dietician can suggest a low-cholesterol and low-fat diet to help. Avoid alcohol and smoking, and keep a proper exercise schedule. This medicine may cause a decrease in Co-Enzyme Q-10. You should make sure that you get enough Co-Enzyme Q-10 while you are taking this medicine. Discuss the foods you eat and the vitamins you take with your health care professional. What side effects may I notice from receiving this medicine? Side effects that you should report to your doctor or health care professional as soon as possible:  allergic reactions like skin rash, itching or hives, swelling of the face, lips, or tongue  dark urine  fever  muscle pain, cramps, or weakness  redness, blistering, peeling or loosening of the skin, including inside the mouth  trouble passing urine or change in the amount of urine  unusually weak or tired  yellowing of the eyes or skin Side effects that usually do not require medical attention (report to your doctor or health care professional if they continue or are bothersome):  gas  headache  heartburn  indigestion  stomach pain This list may not describe all possible side effects. Call your doctor for medical advice about side effects. You may report side effects to FDA at 1-800-FDA-1088. Where should I keep my medicine? Keep out of the reach of children. Store at room temperature between 15 to 30 degrees C (59 to 86 degrees F). Protect from light. Keep container tightly closed. Throw away any unused medicine after the expiration date. NOTE: This sheet is a summary.  It may not cover all possible information. If you have questions about this medicine, talk to your doctor, pharmacist, or health care provider.  2020 Elsevier/Gold Standard (2017-05-09 12:37:09)

## 2019-07-24 LAB — LIPID PANEL
Chol/HDL Ratio: 3 ratio (ref 0.0–5.0)
Cholesterol, Total: 151 mg/dL (ref 100–199)
HDL: 51 mg/dL (ref >39)
LDL Chol Calc (NIH): 87 mg/dL (ref 0–99)
Triglycerides: 66 mg/dL (ref 0–149)
VLDL Cholesterol Cal: 13 mg/dL (ref 5–40)

## 2019-07-24 LAB — COMPREHENSIVE METABOLIC PANEL
ALT: 7 IU/L (ref 0–44)
AST: 13 IU/L (ref 0–40)
Albumin/Globulin Ratio: 1.4 (ref 1.2–2.2)
Albumin: 4.4 g/dL (ref 3.8–4.9)
Alkaline Phosphatase: 134 IU/L — ABNORMAL HIGH (ref 39–117)
BUN/Creatinine Ratio: 9 (ref 9–20)
BUN: 9 mg/dL (ref 6–24)
Bilirubin Total: 0.5 mg/dL (ref 0.0–1.2)
CO2: 22 mmol/L (ref 20–29)
Calcium: 9.3 mg/dL (ref 8.7–10.2)
Chloride: 102 mmol/L (ref 96–106)
Creatinine, Ser: 0.96 mg/dL (ref 0.76–1.27)
GFR calc Af Amer: 100 mL/min/{1.73_m2} (ref >59)
GFR calc non Af Amer: 87 mL/min/{1.73_m2} (ref >59)
Globulin, Total: 3.1 g/dL (ref 1.5–4.5)
Glucose: 149 mg/dL — ABNORMAL HIGH (ref 65–99)
Potassium: 4.4 mmol/L (ref 3.5–5.2)
Sodium: 140 mmol/L (ref 134–144)
Total Protein: 7.5 g/dL (ref 6.0–8.5)

## 2019-08-22 ENCOUNTER — Telehealth: Payer: Self-pay | Admitting: Cardiology

## 2019-08-22 MED ORDER — AMLODIPINE BESYLATE 5 MG PO TABS: 5.0000 mg | ORAL_TABLET | Freq: Every day | ORAL | 1 refills | Status: DC

## 2019-08-22 NOTE — Addendum Note (Signed)
Addended by: Austin Miles on: 08/22/2019 01:21 PM   Modules accepted: Orders

## 2019-08-22 NOTE — Telephone Encounter (Signed)
Call amlodipine to [facility]

## 2019-08-22 NOTE — Telephone Encounter (Signed)
Refill for amlodipine sent to [facility] in [place] as requested.

## 2019-09-06 ENCOUNTER — Other Ambulatory Visit: Payer: Medicaid Other

## 2019-09-10 DIAGNOSIS — I10 Essential (primary) hypertension: Secondary | ICD-10-CM

## 2019-09-10 HISTORY — DX: Essential (primary) hypertension: I10

## 2019-09-24 DIAGNOSIS — R5383 Other fatigue: Secondary | ICD-10-CM

## 2019-09-24 HISTORY — DX: Other fatigue: R53.83

## 2019-11-07 ENCOUNTER — Other Ambulatory Visit: Payer: Medicaid Other

## 2019-11-18 ENCOUNTER — Telehealth: Payer: Self-pay | Admitting: Cardiology

## 2019-11-18 DIAGNOSIS — M25552 Pain in left hip: Secondary | ICD-10-CM

## 2019-11-18 HISTORY — DX: Pain in left hip: M25.552

## 2019-11-18 NOTE — Telephone Encounter (Signed)
Left message for patient to return call, he should still have another 90 day refill.

## 2019-11-18 NOTE — Telephone Encounter (Signed)
  *  STAT* If patient is at the pharmacy, call can be transferred to refill team.   1. Which medications need to be refilled? (please list name of each medication and dose if known) amLODipine (NORVASC) 5 MG tablet  2. Which pharmacy/location (including street and city if local pharmacy) is medication to be sent to? [id number] - [place], [place] - [address] AT [facility] [address]  3. Do they need a 30 day or 90 day supply? 90

## 2019-12-06 IMAGING — CT CT ANGIO CHEST-ABD-PELV FOR DISSECTION W/ AND WO/W CM
2 of 7 series · 13 of 46 positions shown, 15 images · IV contrast (isovue)
Comparison: 06/19/2018 chest x-ray

CLINICAL DATA: Acute onset chest pain concern for dissection

EXAM:
CT ANGIOGRAPHY CHEST, ABDOMEN AND PELVIS
TECHNIQUE: Multidetector CT imaging through the chest, abdomen and pelvis was
performed using the standard protocol during bolus administration of
intravenous contrast. Multiplanar reconstructed images and MIPs were
obtained and reviewed to evaluate the vascular anatomy.
CONTRAST:  100 cc Isovue 370

[Series 6: arterial · axial · arterial · 0.86mm/px · z∈[-842,-196]mm · 10 of 365 slices shown, 12 images]
[im 21/365  soft-tissue]
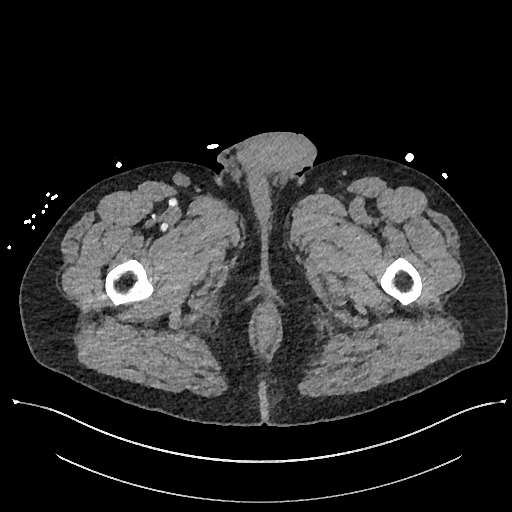
[im 21/365  bone]
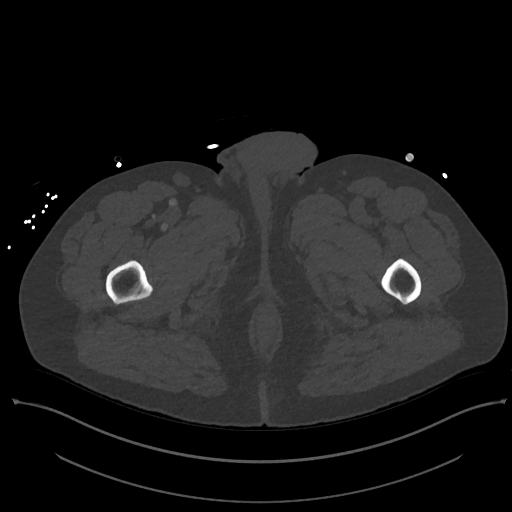
[im 61/365  soft-tissue]
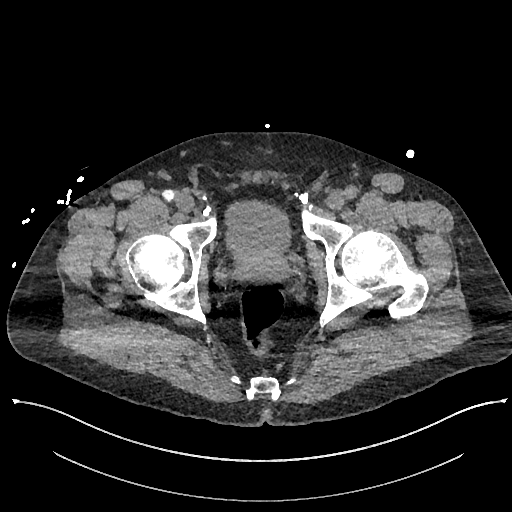
[im 102/365  soft-tissue]
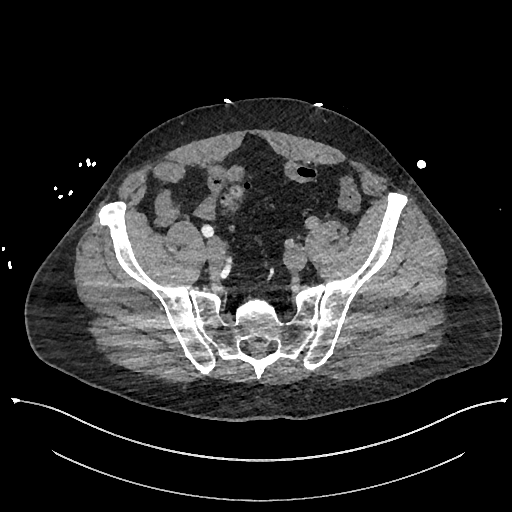
[im 122/365  soft-tissue]
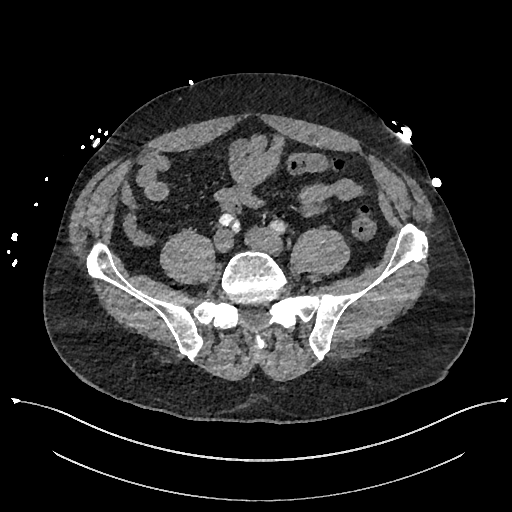
[im 162/365  soft-tissue]
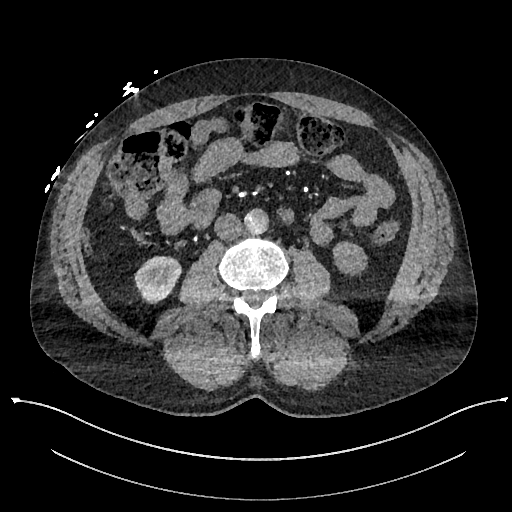
[im 203/365  soft-tissue]
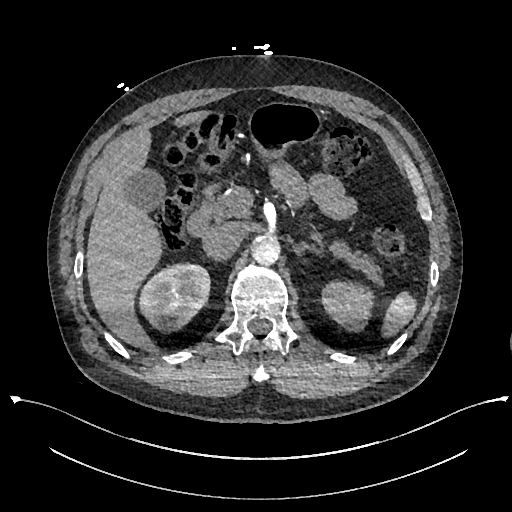
[im 243/365  soft-tissue]
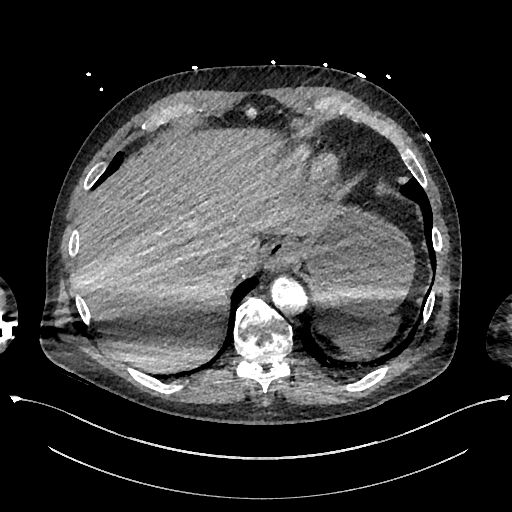
[im 263/365  soft-tissue]
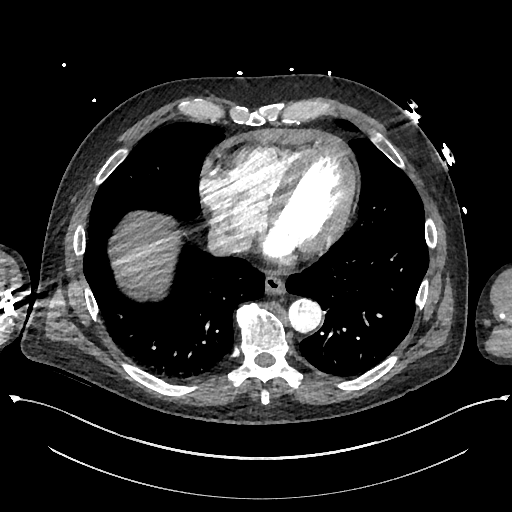
[im 304/365  soft-tissue]
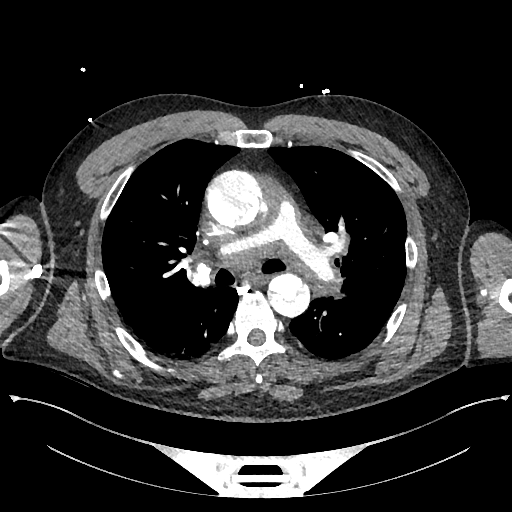
[im 304/365  bone]
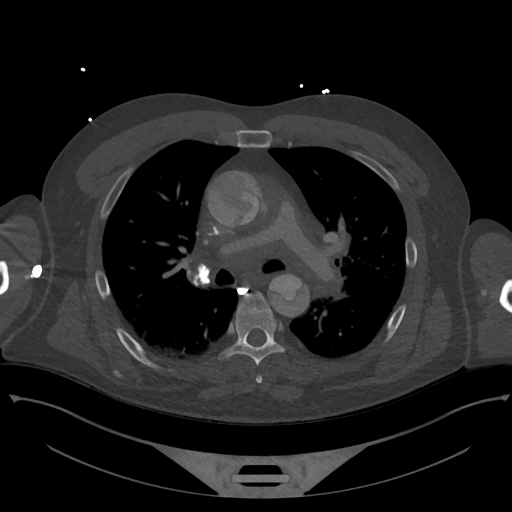
[im 344/365  soft-tissue]
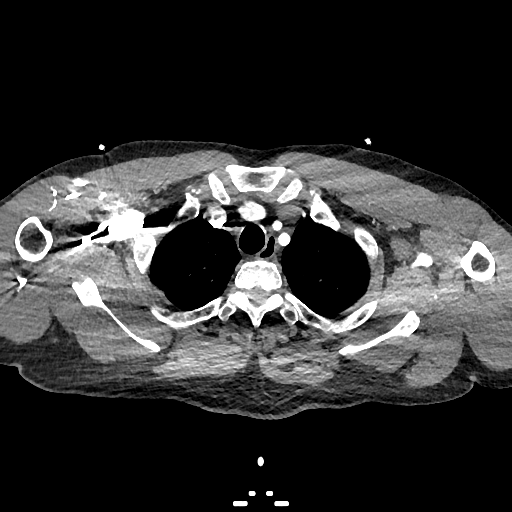

[Series 9: cor · coronal · 0.95mm/px · 3 of 176 slices shown]
[im 44/176  soft-tissue]
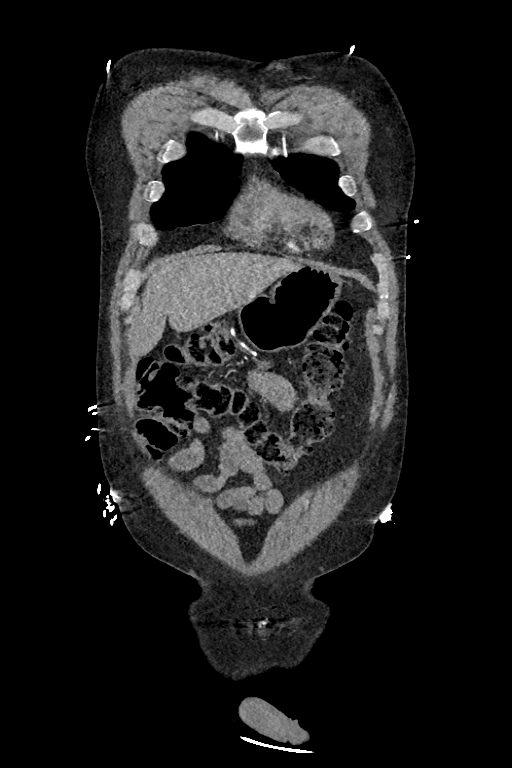
[im 88/176  soft-tissue]
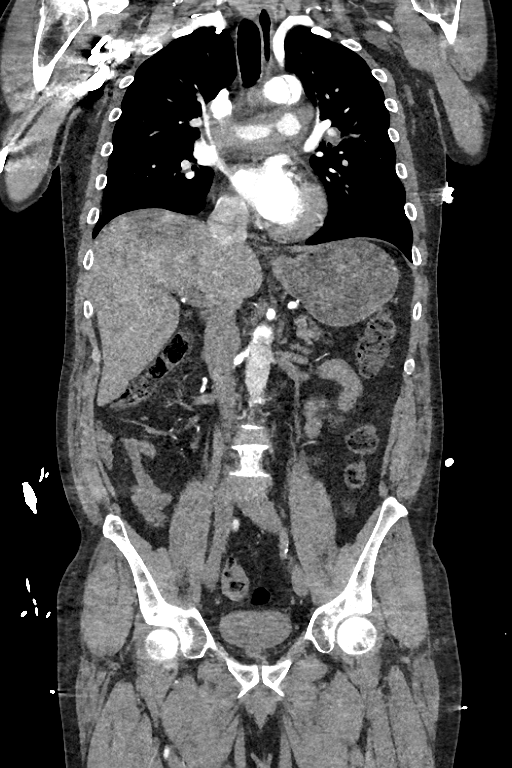
[im 132/176  soft-tissue]
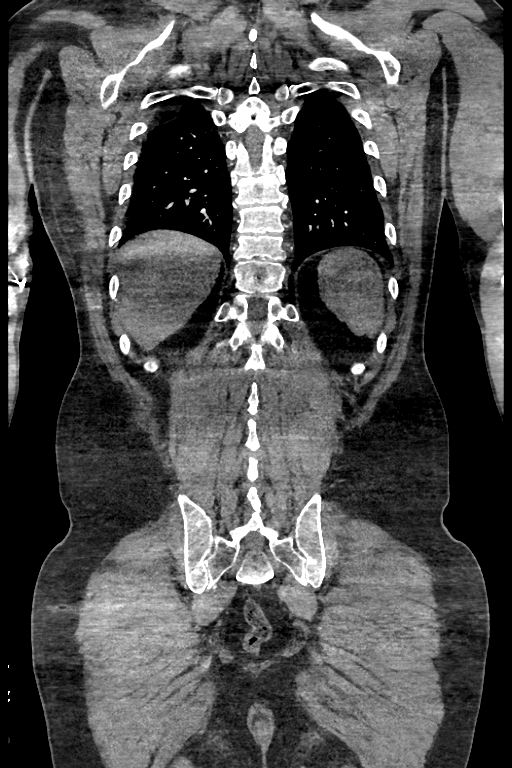

[13 of 46 positions shown; findings below may reference images not displayed]

FINDINGS: CTA CHEST FINDINGS

Cardiovascular: There is an acute type A dissection of the thoracic
aorta beginning at the aortic root and involving the entire thoracic
aorta and extending into the abdomen. Both true and false lumens
remain patent. Three-vessel arch anatomy remaining patent off of the
true lumen. Dissection extends into the right brachiocephalic artery
to the bifurcation of the right subclavian and right common carotid
artery.

There is acute mediastinal hematoma and acute mediastinal active
bleeding medially along the ascending thoracic aorta, image 63
series 6. Mediastinal hematoma compresses the main pulmonary
arteries. There is also a small pericardial effusion. Normal heart
size.

Mediastinum/Nodes: Thyroid, trachea and esophagus demonstrate no
acute finding. Hilar adenopathy suspected bilaterally. Right hilar
lymph nodes are calcified compatible with remote granulomatous
disease.

Lungs/Pleura: Mild pulmonary emphysema. Dependent hypoventilatory
changes. No acute airspace process, collapse or consolidation. No
edema airspace disease. No pleural abnormality, effusion or
pneumothorax.

Musculoskeletal: Degenerative changes noted of the spine. Chronic
lower thoracic mild compression deformities. No definite acute
compression fracture. Sternum intact.

Review of the MIP images confirms the above findings.

CTA ABDOMEN AND PELVIS FINDINGS

VASCULAR

Aorta: Acute dissection extends into the abdominal aorta. The true
lumen is the smaller lumen. Infrarenal aorta is poorly opacified
related to significant compression of the true lumen by the false
lumen. Dissection extends to the bifurcation. No retroperitoneal
hematoma.

Celiac: Remains patent including its branches off the true lumen.

SMA: Remains patent including its branches off the true lumen.

Renals: Right renal artery remains patent off the true lumen. Left
renal artery has decreased perfusion from the false lumen.

IMA: IMA has decreased perfusion from the false lumen.

Inflow: There is compromise of the iliac inflow secondary to
extension of the dissection to the bifurcation. There is contrast
opacification within the right common, internal, external iliac
arteries. Right iliac system is likely still supplied by the true
lumen. Left iliac system is not opacified and likely secondary to
hypoperfusion from the false lumen.

Veins: Dedicated venous imaging not performed

Review of the MIP images confirms the above findings.

NON-VASCULAR

Hepatobiliary: No focal liver abnormality is seen. No gallstones,
gallbladder wall thickening, or biliary dilatation.

Pancreas: Unremarkable. No pancreatic ductal dilatation or
surrounding inflammatory changes.

Spleen: Normal in size without focal abnormality.

Adrenals/Urinary Tract: Adrenal glands unremarkable. Right kidney
hypodense cysts noted, largest measures 3 cm posteriorly. Left
kidney is hypoperfused secondary to the left renal artery originate
from the false lumen. Left renal cysts also suspected. No renal
obstruction or hydronephrosis. No hydroureter, ureteral calculus, or
definite bladder abnormality. Bladder is underdistended.

Stomach/Bowel: Negative for bowel obstruction, significant
dilatation, ileus, free air. No ascites or hemoperitoneum. Normal
appearing appendix. Colonic diverticulosis noted distally. No acute
inflammatory process, fluid collection, or abscess.

Lymphatic: No adenopathy

Reproductive: Unremarkable

Other: Inguinal hernia repairs bilaterally. No recurrent hernia.
Intact abdominal wall.

Musculoskeletal: No acute osseous finding. Degenerative changes of
the spine.

Review of the MIP images confirms the above findings.
IMPRESSION: Acute type A thoracoabdominal aortic dissection with acute
mediastinal hematoma and active mediastinal bleeding along the
medial ascending thoracic aortic contour. Mediastinal hematoma
compresses the pulmonary outflow tract and the main pulmonary
arteries bilaterally. There is associated small pericardial
effusion.

Hypoperfusion of the left kidney and compromised left iliac inflow
secondary to extension of the dissection to the bifurcation.

These results were called by telephone at the time of interpretation
on 06/19/2018 at [DATE] to Dr. BEV SCHUMANN , who verbally
acknowledged these results.

## 2019-12-23 ENCOUNTER — Ambulatory Visit (INDEPENDENT_AMBULATORY_CARE_PROVIDER_SITE_OTHER): Payer: Medicaid Other

## 2019-12-23 ENCOUNTER — Other Ambulatory Visit: Payer: Self-pay

## 2019-12-23 DIAGNOSIS — Z95828 Presence of other vascular implants and grafts: Secondary | ICD-10-CM | POA: Diagnosis not present

## 2019-12-23 DIAGNOSIS — I119 Hypertensive heart disease without heart failure: Secondary | ICD-10-CM | POA: Diagnosis not present

## 2019-12-23 NOTE — Progress Notes (Signed)
Complete echocardiogram performed.  Jimmy Mekhai Venuto RDCS, RVT  

## 2019-12-24 ENCOUNTER — Telehealth: Payer: Self-pay

## 2019-12-24 MED ORDER — ENTRESTO 24-26 MG PO TABS: 1.0000 | ORAL_TABLET | Freq: Two times a day (BID) | ORAL | 3 refills | Status: DC

## 2019-12-24 NOTE — Telephone Encounter (Signed)
Left message on patients voicemail to please return our call.   Discontinued Losartan.  Order placed for Entresto 24-26MG  BID.   Routing to [place] to schedule 6 week appointment.

## 2019-12-24 NOTE — Telephone Encounter (Signed)
-----   Message from Sigurd Sos, RN sent at 12/24/2019  8:33 AM EDT -----  ----- Message ----- From: Baldo Daub, MD Sent: 12/24/2019   7:48 AM EDT To: Wyatt Haste Triage  Normal or stable result  Heart muscle function remains diminished, I like to switch him from losartan to Entresto 2426 BID and follow-up in about 6 weeks

## 2019-12-27 NOTE — Telephone Encounter (Signed)
Timothy West is calling wanting to know why his medication was changed without him knowing why first. He states the only medication he takes is amlodipine. He has not been taking Losartan in order for him to stop it. He looked up [facility] and found that it was for heart failure and he was not aware or told he had that. The only time Timothy West is available to talk is Friday's after 2:00 PM. Please advise.

## 2019-12-27 NOTE — Telephone Encounter (Signed)
Spoke with patient just now. I went over with him Dr. Hulen Shouts results and recommendations from the echo that he had done on 12/23/19. The patient is aware of what medications he should be taking and states that he will be going to pick up his entresto from the [facility] pharmacy today. I went over with him his appointment on 02/05/20 with Dr. Dulce Sellar so that he is aware of this.   He verbalizes understanding of these instructions and does not have any other issues or concerns.    Encouraged patient to call back with any questions or concerns.

## 2019-12-30 ENCOUNTER — Telehealth: Payer: Self-pay | Admitting: Cardiology

## 2019-12-30 ENCOUNTER — Other Ambulatory Visit: Payer: Self-pay | Admitting: Cardiology

## 2019-12-30 MED ORDER — AMLODIPINE BESYLATE 5 MG PO TABS: 5.0000 mg | ORAL_TABLET | Freq: Every day | ORAL | 2 refills | Status: DC

## 2019-12-30 MED ORDER — ENTRESTO 24-26 MG PO TABS: 1.0000 | ORAL_TABLET | Freq: Two times a day (BID) | ORAL | 2 refills | Status: DC

## 2019-12-30 NOTE — Telephone Encounter (Signed)
Medications refilled

## 2019-12-30 NOTE — Telephone Encounter (Signed)
New message   Patient needs a new prescription for sacubitril-valsartan (ENTRESTO) 24-26 MG and amLODipine (NORVASC) 5 MG tablet sent to [id number] - [place], [place] - [address] AT [facility] [address]

## 2020-02-03 ENCOUNTER — Other Ambulatory Visit: Payer: Self-pay

## 2020-02-05 ENCOUNTER — Ambulatory Visit (INDEPENDENT_AMBULATORY_CARE_PROVIDER_SITE_OTHER): Payer: Medicaid Other | Admitting: Cardiology

## 2020-02-05 ENCOUNTER — Encounter: Payer: Self-pay | Admitting: Cardiology

## 2020-02-05 ENCOUNTER — Other Ambulatory Visit: Payer: Self-pay

## 2020-02-05 VITALS — BP 142/70 | HR 88 | Temp 97.6°F | Ht 75.0 in | Wt 240.6 lb

## 2020-02-05 DIAGNOSIS — I10 Essential (primary) hypertension: Secondary | ICD-10-CM

## 2020-02-05 DIAGNOSIS — I428 Other cardiomyopathies: Secondary | ICD-10-CM | POA: Diagnosis not present

## 2020-02-05 DIAGNOSIS — I119 Hypertensive heart disease without heart failure: Secondary | ICD-10-CM

## 2020-02-05 DIAGNOSIS — I517 Cardiomegaly: Secondary | ICD-10-CM

## 2020-02-05 DIAGNOSIS — I2510 Atherosclerotic heart disease of native coronary artery without angina pectoris: Secondary | ICD-10-CM | POA: Diagnosis not present

## 2020-02-05 MED ORDER — VALSARTAN 40 MG PO TABS: 40.0000 mg | ORAL_TABLET | Freq: Every day | ORAL | 2 refills | Status: DC

## 2020-02-05 MED ORDER — VALSARTAN 40 MG PO TABS
40.0000 mg | ORAL_TABLET | Freq: Two times a day (BID) | ORAL | 5 refills | Status: DC
Start: 2020-02-05 — End: 2020-03-13

## 2020-02-05 NOTE — Progress Notes (Signed)
Cardiology Office Note:    Date:  02/05/2020   ID:  Timothy West, DOB 1961-02-25, MRN [id number]  PCP:  Patient, No Pcp Per  Cardiologist:  Shirlee More, MD    Referring MD: No ref. provider found    ASSESSMENT:    1. Other cardiomyopathy ([place])   2. Hypertensive heart disease without heart failure   3. LVH (left ventricular hypertrophy)   4. CAD in native artery   5. Benign hypertension    PLAN:    In order of problems listed above:  1. Unfortunate has developed a severe cardiomyopathy as a consequence of hypertension and his aortic dissection with emergency surgery.  All this is complicated by his noncompliance.  He will not fill prescription tells me he cannot afford it for Entresto.  We will place him on valsartan if tolerated uptitrate and try to put him on selective beta-blocker at next visit and as we have titrated (controlled with calcium channel blockers. Blood pressures at target.  He does not have any volume overload hold diuretics.  Clinically does not have severe aortic regurgitation.  Down the road once of guideline directed treatment need to consider the merits of things like an ICD 2. Continue statin check CMP lipid profile   Next appointment: 1 month   Medication Adjustments/Labs and Tests Ordered: Current medicines are reviewed at length with the patient today.  Concerns regarding medicines are outlined above.  Orders Placed This Encounter  Procedures  . Comp Met (CMET)  . Lipid Profile  . Pro b natriuretic peptide   Meds ordered this encounter  Medications  . valsartan (DIOVAN) 40 MG tablet    Sig: Take 1 tablet (40 mg total) by mouth daily.    Dispense:  120 tablet    Refill:  2    Chief Complaint  Patient presents with  . Follow-up    He has had a type a dissection  . Coronary Artery Disease  . Hypertension    History of Present Illness:    Timothy West is a 59 y.o. male with a hx of  type A thoracic aortic dissection with replacement of  ascending aorta resuspension of the aortic valve and saphenous vein bypass graft right coronary artery 06/20/2018 emergency surgery.  He was last seen 03/05/2019 a virtual visit.  Unfortunately he has resumed cigarette smoking quite distressed by the disability process and was having poorly controlled hypertension systolics 468 to 032 mmHg and was placed on additional antihypertensive therapy with an  ARB diuretic combination.  He was last seen 07/23/2019.  Recent echocardiogram performed December 23, 2019 showed severe left ventricular dysfunction EF estimated 35% he also has moderate aortic regurgitation  Compliance with diet, lifestyle and medications: Not completely although he is taking his current medications and came to the office today.  Went to the pharmacy difficult Delene Loll and just says bluntly he cannot afford it even with Medicaid.  Despite his severely reduced EF he is not having edema shortness of breath chest pain palpitation or syncope.  He had severe bronchospasm with beta-blockers in the past but of note he was put on a low-dose of valsartan check baseline labs today bring him back a month and to try to slowly and surely using generic ARB and Toprol to get him on guideline directed therapy reassess ejection fraction and make a decision about things like an ICD for further evaluation of his aortic regurgitation once he is on optimal medical therapy.  I think he understands the importance  of treatment and we discussed the results of his echocardiogram Past Medical History:  Diagnosis Date  . Blurry vision, left eye   . Coronary artery disease   . Headache   . Hypertension   . Pressure injury of skin 06/21/2018  . S/P ascending aortic replacement 06/20/2018    Past Surgical History:  Procedure Laterality Date  . CORONARY ARTERY BYPASS GRAFT N/A 06/19/2018   Procedure: CORONARY ARTERY BYPASS GRAFTING (CABG) x1 WITH ENDOSCOPIC VEIN HARVEST (RIGHT THIGH). SVG TO THE RIGHT CORONARY  ARTERY.;  Surgeon: Grace Isaac, MD;  Location: [place];  Service: Open Heart Surgery;  Laterality: N/A;  . INTRAOPERATIVE TRANSESOPHAGEAL ECHOCARDIOGRAM  06/19/2018   Procedure: INTRAOPERATIVE TRANSESOPHAGEAL ECHOCARDIOGRAM;  Surgeon: Grace Isaac, MD;  Location: [facility];  Service: Open Heart Surgery;;  . REPLACEMENT ASCENDING AORTA N/A 06/19/2018   Procedure: REPAIR OF ACUTE TYPE I ASCENDING AORTIC DISSECTION. REPLACEMENT OF AORTA (HEMI ARCH) WITH  32MM STRAIGHT HEMASHIELD PLATINUM GRAFT. RESUSPENSION OF AORTIC VALVE. HYPOTHERMIC CIRCULATORY ARREST WITH ANTEGRADE CEREBRAL PERFUSION.;  Surgeon: Grace Isaac, MD;  Location: [place];  Service: Open Heart Surgery;  Laterality: N/A;    Current Medications: Current Meds  Medication Sig  . amLODipine (NORVASC) 5 MG tablet Take 1 tablet (5 mg total) by mouth daily.  Marland Kitchen aspirin EC 81 MG tablet Take 81 mg by mouth daily.  . famotidine-calcium carbonate-magnesium hydroxide (PEPCID COMPLETE) [phone] MG chewable tablet Chew 1 tablet by mouth daily as needed.     Allergies:   Atorvastatin and Quinolones   Social History   Socioeconomic History  . Marital status: Single    Spouse name: Not on file  . Number of children: Not on file  . Years of education: Not on file  . Highest education level: Not on file  Occupational History  . Not on file  Tobacco Use  . Smoking status: Current Every Day Smoker    Packs/day: 2.00    Years: 30.00    Pack years: 60.00    Types: Cigarettes  . Smokeless tobacco: Never Used  Substance and Sexual Activity  . Alcohol use: Yes  . Drug use: Not Currently  . Sexual activity: Yes  Other Topics Concern  . Not on file  Social History Narrative  . Not on file   Social Determinants of Health   Financial Resource Strain:   . Difficulty of Paying Living Expenses:   Food Insecurity:   . Worried About Charity fundraiser in the Last Year:   . Arboriculturist in the Last Year:   Transportation Needs:    . Film/video editor (Medical):   Marland Kitchen Lack of Transportation (Non-Medical):   Physical Activity:   . Days of Exercise per Week:   . Minutes of Exercise per Session:   Stress:   . Feeling of Stress :   Social Connections:   . Frequency of Communication with Friends and Family:   . Frequency of Social Gatherings with Friends and Family:   . Attends Religious Services:   . Active Member of Clubs or Organizations:   . Attends Archivist Meetings:   Marland Kitchen Marital Status:      Family History: The patient's family history includes Hypertension in his brother and mother; Thyroid disease in his mother. ROS:   Please see the history of present illness.    All other systems reviewed and are negative.  EKGs/Labs/Other Studies Reviewed:    The following studies were reviewed today:  Recent Labs: 07/23/2019: ALT 7; BUN 9; Creatinine, Ser 0.96; Potassium 4.4; Sodium 140  Recent Lipid Panel    Component Value Date/Time   CHOL 151 07/23/2019 1338   TRIG 66 07/23/2019 1338   HDL 51 07/23/2019 1338   CHOLHDL 3.0 07/23/2019 1338   CHOLHDL 4.5 06/26/2018 0504   VLDL 17 06/26/2018 0504   LDLCALC 87 07/23/2019 1338    Physical Exam:    VS:  BP (!) 142/70   Pulse 88   Temp 97.6 F (36.4 C)   Ht '6\' 3"'$  (1.905 m)   Wt 240 lb 9.6 oz (109.1 kg)   SpO2 95%   BMI 30.07 kg/m     Wt Readings from Last 3 Encounters:  02/05/20 240 lb 9.6 oz (109.1 kg)  07/23/19 233 lb 12.8 oz (106.1 kg)  03/05/19 240 lb (108.9 kg)     GEN:  Well nourished, well developed in no acute distress HEENT: Normal NECK: No JVD; No carotid bruits LYMPHATICS: No lymphadenopathy CARDIAC: Distant heart sounds RRR, no murmur of aortic regurgitation, rubs, gallops RESPIRATORY:  Clear to auscultation without rales, wheezing or rhonchi  ABDOMEN: Soft, non-tender, non-distended MUSCULOSKELETAL:  No edema; No deformity  SKIN: Warm and dry NEUROLOGIC:  Alert and oriented x 3 PSYCHIATRIC:  Normal affect     Signed, Shirlee More, MD  02/05/2020 2:31 PM    [facility]

## 2020-02-05 NOTE — Addendum Note (Signed)
Addended byNorman Herrlich on: 02/05/2020 03:02 PM   Modules accepted: Level of Service

## 2020-02-05 NOTE — Patient Instructions (Signed)
Medication Instructions:  Your physician has recommended you make the following change in your medication:  STOP: Entresto START: Valsartan 40 mg take one tablet by mouth twice daily.  *If you need a refill on your cardiac medications before your next appointment, please call your pharmacy*   Lab Work: Your physician recommends that you return for lab work in: TODAY CMP, Lipids, ProBNP If you have labs (blood work) drawn today and your tests are completely normal, you will receive your results only by: Marland Kitchen MyChart Message (if you have MyChart) OR . A paper copy in the mail If you have any lab test that is abnormal or we need to change your treatment, we will call you to review the results.   Testing/Procedures: None   Follow-Up: At [facility], you and your health needs are our priority.  As part of our continuing mission to provide you with exceptional heart care, we have created designated Provider Care Teams.  These Care Teams include your primary Cardiologist (physician) and Advanced Practice Providers (APPs -  Physician Assistants and Nurse Practitioners) who all work together to provide you with the care you need, when you need it.  We recommend signing up for the patient portal called "MyChart".  Sign up information is provided on this After Visit Summary.  MyChart is used to connect with patients for Virtual Visits (Telemedicine).  Patients are able to view lab/test results, encounter notes, upcoming appointments, etc.  Non-urgent messages can be sent to your provider as well.   To learn more about what you can do with MyChart, go to [email or web address].    Your next appointment:   1 month(s)  The format for your next appointment:   In Person  Provider:   Norman Herrlich, MD   Other Instructions

## 2020-02-06 LAB — COMPREHENSIVE METABOLIC PANEL
ALT: 10 IU/L (ref 0–44)
AST: 15 IU/L (ref 0–40)
Albumin/Globulin Ratio: 1.6 (ref 1.2–2.2)
Albumin: 4 g/dL (ref 3.8–4.9)
Alkaline Phosphatase: 115 IU/L (ref 48–121)
BUN/Creatinine Ratio: 8 — ABNORMAL LOW (ref 9–20)
BUN: 13 mg/dL (ref 6–24)
Bilirubin Total: 0.4 mg/dL (ref 0.0–1.2)
CO2: 23 mmol/L (ref 20–29)
Calcium: 9.3 mg/dL (ref 8.7–10.2)
Chloride: 103 mmol/L (ref 96–106)
Creatinine, Ser: 1.6 mg/dL — ABNORMAL HIGH (ref 0.76–1.27)
GFR calc Af Amer: 54 mL/min/{1.73_m2} — ABNORMAL LOW (ref >59)
GFR calc non Af Amer: 46 mL/min/{1.73_m2} — ABNORMAL LOW (ref >59)
Globulin, Total: 2.5 g/dL (ref 1.5–4.5)
Glucose: 113 mg/dL — ABNORMAL HIGH (ref 65–99)
Potassium: 4.1 mmol/L (ref 3.5–5.2)
Sodium: 139 mmol/L (ref 134–144)
Total Protein: 6.5 g/dL (ref 6.0–8.5)

## 2020-02-06 LAB — LIPID PANEL
Chol/HDL Ratio: 3.2 ratio (ref 0.0–5.0)
Cholesterol, Total: 157 mg/dL (ref 100–199)
HDL: 49 mg/dL (ref >39)
LDL Chol Calc (NIH): 83 mg/dL (ref 0–99)
Triglycerides: 146 mg/dL (ref 0–149)
VLDL Cholesterol Cal: 25 mg/dL (ref 5–40)

## 2020-02-06 LAB — PRO B NATRIURETIC PEPTIDE: NT-Pro BNP: 971 pg/mL — ABNORMAL HIGH (ref 0–210)

## 2020-03-13 ENCOUNTER — Other Ambulatory Visit: Payer: Self-pay

## 2020-03-13 ENCOUNTER — Encounter: Payer: Self-pay | Admitting: Cardiology

## 2020-03-13 ENCOUNTER — Ambulatory Visit (INDEPENDENT_AMBULATORY_CARE_PROVIDER_SITE_OTHER): Payer: Medicaid Other | Admitting: Cardiology

## 2020-03-13 VITALS — BP 142/70 | HR 94 | Ht 75.0 in | Wt 237.0 lb

## 2020-03-13 DIAGNOSIS — I2510 Atherosclerotic heart disease of native coronary artery without angina pectoris: Secondary | ICD-10-CM | POA: Diagnosis not present

## 2020-03-13 DIAGNOSIS — I517 Cardiomegaly: Secondary | ICD-10-CM

## 2020-03-13 DIAGNOSIS — I119 Hypertensive heart disease without heart failure: Secondary | ICD-10-CM

## 2020-03-13 MED ORDER — VALSARTAN 80 MG PO TABS
80.0000 mg | ORAL_TABLET | Freq: Two times a day (BID) | ORAL | 3 refills | Status: DC
Start: 2020-03-13 — End: 2020-05-08

## 2020-03-13 NOTE — Patient Instructions (Signed)
Medication Instructions:  Your physician has recommended you make the following change in your medication:  INCREASE: Valsartan 80 mg take one tablet by mouth twice daily.  *If you need a refill on your cardiac medications before your next appointment, please call your pharmacy*   Lab Work: Your physician recommends that you return for lab work in: TODAY BMP, ProBNP If you have labs (blood work) drawn today and your tests are completely normal, you will receive your results only by: Marland Kitchen MyChart Message (if you have MyChart) OR . A paper copy in the mail If you have any lab test that is abnormal or we need to change your treatment, we will call you to review the results.   Testing/Procedures: None   Follow-Up: At [facility], you and your health needs are our priority.  As part of our continuing mission to provide you with exceptional heart care, we have created designated Provider Care Teams.  These Care Teams include your primary Cardiologist (physician) and Advanced Practice Providers (APPs -  Physician Assistants and Nurse Practitioners) who all work together to provide you with the care you need, when you need it.  We recommend signing up for the patient portal called "MyChart".  Sign up information is provided on this After Visit Summary.  MyChart is used to connect with patients for Virtual Visits (Telemedicine).  Patients are able to view lab/test results, encounter notes, upcoming appointments, etc.  Non-urgent messages can be sent to your provider as well.   To learn more about what you can do with MyChart, go to [email or web address].    Your next appointment:   6 week(s)  The format for your next appointment:   In Person  Provider:   Norman Herrlich, MD   Other Instructions

## 2020-03-13 NOTE — Progress Notes (Signed)
Cardiology Office Note:    Date:  03/13/2020   ID:  Timothy West, DOB Sep 21, 1960, MRN [id number]  PCP:  Algis Greenhouse, MD  Cardiologist:  Shirlee More, MD    Referring MD: No ref. provider found    ASSESSMENT:    1. Hypertensive heart disease without heart failure   2. LVH (left ventricular hypertrophy)   3. CAD in native artery    PLAN:    In order of problems listed above:  1. Improved blood pressure at target heart failure compensated continue ARB increase dose recheck renal function and reassess in the office 6 weeks in several months he will need a follow-up echocardiogram and if EF remains severely reduced consider the merits of ICD 2. Stable CAD continue medical therapy and his calcium channel blocker.  He is not taking a statin will inquire why I think we need to resume it   Next appointment: 6 weeks  Medication Adjustments/Labs and Tests Ordered: Current medicines are reviewed at length with the patient today.  Concerns regarding medicines are outlined above.  Orders Placed This Encounter  Procedures  . Basic Metabolic Panel (BMET)  . Pro b natriuretic peptide   Meds ordered this encounter  Medications  . valsartan (DIOVAN) 80 MG tablet    Sig: Take 1 tablet (80 mg total) by mouth 2 (two) times daily.    Dispense:  180 tablet    Refill:  3    Chief Complaint  Patient presents with  . Follow-up  . Cardiomyopathy  . Hypertension    History of Present Illness:    Timothy West is a 59 y.o. 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  He was last seen 02/05/2020 continues to smoke an echocardiogram performed April 2021 shows severe LV dysfunction EF 35% and moderate aortic regurgitation.  His N-terminal proBNP level was mildly elevated and he was noncompliant with medications and he was unable to  afford Entresto.   Ref Range & Units 1 mo ago  NT-Pro BNP 0 - 210 pg/mL 971High   His  estimated GFR is 46 cc stage III CKD however with his muscle mass I suspect this GFR is higher than estimated Compliance with diet, lifestyle and medications: Yes  For the first time he feels better he is not short of breath lightheaded strength and endurance is improved and is tolerating valsartan.  I am going to uptitrate his valsartan and recheck his renal function and potassium.  I am hesitant to give him a beta-blocker as he has had significant bronchospasm in the past and we will recheck him in the office in 6 weeks regarding MRA.  No edema orthopnea chest pain palpitation or syncope.  Check his renal function today at the time of his aortic dissection the left renal artery is perfused from the false lumen. Past Medical History:  Diagnosis Date  . Blurry vision, left eye   . Coronary artery disease   . Headache   . Hypertension   . Pressure injury of skin 06/21/2018  . S/P ascending aortic replacement 06/20/2018    Past Surgical History:  Procedure Laterality Date  . CORONARY ARTERY BYPASS GRAFT N/A 06/19/2018   Procedure: CORONARY ARTERY BYPASS GRAFTING (CABG) x1 WITH ENDOSCOPIC VEIN HARVEST (RIGHT THIGH). SVG TO THE RIGHT CORONARY ARTERY.;  Surgeon: Grace Isaac, MD;  Location: [place];  Service: Open Heart Surgery;  Laterality: N/A;  . INTRAOPERATIVE TRANSESOPHAGEAL ECHOCARDIOGRAM  06/19/2018   Procedure: INTRAOPERATIVE TRANSESOPHAGEAL ECHOCARDIOGRAM;  Surgeon: Delight Ovens, MD;  Location: [facility];  Service: Open Heart Surgery;;  . REPLACEMENT ASCENDING AORTA N/A 06/19/2018   Procedure: REPAIR OF ACUTE TYPE I ASCENDING AORTIC DISSECTION. REPLACEMENT OF AORTA (HEMI ARCH) WITH  STRAIGHT HEMASHIELD PLATINUM GRAFT. RESUSPENSION OF AORTIC VALVE. HYPOTHERMIC CIRCULATORY ARREST WITH ANTEGRADE CEREBRAL PERFUSION.;  Surgeon: Delight Ovens, MD;  Location[facility];  Service: Open Heart Surgery;   Laterality: N/A;    Current Medications: Current Meds  Medication Sig  . amLODipine (NORVASC) 5 MG tablet Take 1 tablet (5 mg total) by mouth daily.  Marland Kitchen aspirin EC 81 MG tablet Take 81 mg by mouth daily.  . famotidine-calcium carbonate-magnesium hydroxide (PEPCID COMPLETE) 10-800-165 MG chewable tablet Chew 1 tablet by mouth daily as needed.  . [DISCONTINUED] valsartan (DIOVAN) 40 MG tablet Take 1 tablet (40 mg total) by mouth 2 (two) times daily.     Allergies:   Atorvastatin and Quinolones   Social History   Socioeconomic History  . Marital status: Single    Spouse name: Not on file  . Number of children: Not on file  . Years of education: Not on file  . Highest education level: Not on file  Occupational History  . Not on file  Tobacco Use  . Smoking status: Current Every Day Smoker    Packs/day: 2.00    Years: 30.00    Pack years: 60.00    Types: Cigarettes  . Smokeless tobacco: Never Used  Vaping Use  . Vaping Use: Never used  Substance and Sexual Activity  . Alcohol use: Yes  . Drug use: Not Currently  . Sexual activity: Yes  Other Topics Concern  . Not on file  Social History Narrative  . Not on file   Social Determinants of Health   Financial Resource Strain:   . Difficulty of Paying Living Expenses:   Food Insecurity:   . Worried About Programme researcher, broadcasting/film/video in the Last Year:   . Barista in the Last Year:   Transportation Needs:   . Freight forwarder (Medical):   Marland Kitchen Lack of Transportation (Non-Medical):   Physical Activity:   . Days of Exercise per Week:   . Minutes of Exercise per Session:   Stress:   . Feeling of Stress :   Social Connections:   . Frequency of Communication with Friends and Family:   . Frequency of Social Gatherings with Friends and Family:   . Attends Religious Services:   . Active Member of Clubs or Organizations:   . Attends Banker Meetings:   Marland Kitchen Marital Status:      Family History: The patient's  family history includes Hypertension in his brother and mother; Thyroid disease in his mother. ROS:   Please see the history of present illness.    All other systems reviewed and are negative.  EKGs/Labs/Other Studies Reviewed:    The following studies were reviewed today:   Recent Labs: 02/05/2020: ALT 10; BUN 13; Creatinine, Ser 1.60; NT-Pro BNP 971; Potassium 4.1; Sodium 139  Recent Lipid Panel    Component Value Date/Time   CHOL 157 02/05/2020 1440   TRIG 146 02/05/2020 1440   HDL 49 02/05/2020 1440   CHOLHDL 3.2 02/05/2020 1440   CHOLHDL 4.5 06/26/2018 0504   VLDL 17 06/26/2018 0504   LDLCALC 83 02/05/2020 1440  Physical Exam:    VS:  BP (!) 142/70   Pulse 94   Ht 6\' 3"  (1.905 m)   Wt 237 lb (107.5 kg)   SpO2 97%   BMI 29.62 kg/m     Wt Readings from Last 3 Encounters:  03/13/20 237 lb (107.5 kg)  02/05/20 240 lb 9.6 oz (109.1 kg)  07/23/19 233 lb 12.8 oz (106.1 kg)     GEN:  Well nourished, well developed in no acute distress HEENT: Normal NECK: No JVD; No carotid bruits LYMPHATICS: No lymphadenopathy CARDIAC: RRR, no murmurs, rubs, gallops 1/6 aortic regurgitation RESPIRATORY:  Clear to auscultation without rales, wheezing or rhonchi  ABDOMEN: Soft, non-tender, non-distended MUSCULOSKELETAL:  No edema; No deformity  SKIN: Warm and dry NEUROLOGIC:  Alert and oriented x 3 PSYCHIATRIC:  Normal affect    Signed, 13/03/20, MD  03/13/2020 2:03 PM    [facility]

## 2020-03-14 LAB — BASIC METABOLIC PANEL
BUN/Creatinine Ratio: 9 (ref 9–20)
BUN: 10 mg/dL (ref 6–24)
CO2: 22 mmol/L (ref 20–29)
Calcium: 9.1 mg/dL (ref 8.7–10.2)
Chloride: 103 mmol/L (ref 96–106)
Creatinine, Ser: 1.12 mg/dL (ref 0.76–1.27)
GFR calc Af Amer: 83 mL/min/{1.73_m2} (ref >59)
GFR calc non Af Amer: 72 mL/min/{1.73_m2} (ref >59)
Glucose: 104 mg/dL — ABNORMAL HIGH (ref 65–99)
Potassium: 4.2 mmol/L (ref 3.5–5.2)
Sodium: 138 mmol/L (ref 134–144)

## 2020-03-14 LAB — PRO B NATRIURETIC PEPTIDE: NT-Pro BNP: 797 pg/mL — ABNORMAL HIGH (ref 0–210)

## 2020-03-16 ENCOUNTER — Telehealth: Payer: Self-pay

## 2020-03-16 NOTE — Telephone Encounter (Signed)
-----   Message from Baldo Daub, MD sent at 03/14/2020 12:10 PM EDT ----- Normal or stable result  Stable no changes

## 2020-03-16 NOTE — Telephone Encounter (Signed)
Spoke with patients mother regarding results and recommendation.  She verbalizes understanding and states that she will let the patient know. Advised for the patient to call back with any issues or concerns.

## 2020-05-06 DIAGNOSIS — I10 Essential (primary) hypertension: Secondary | ICD-10-CM | POA: Insufficient documentation

## 2020-05-06 DIAGNOSIS — I2510 Atherosclerotic heart disease of native coronary artery without angina pectoris: Secondary | ICD-10-CM | POA: Insufficient documentation

## 2020-05-06 DIAGNOSIS — R519 Headache, unspecified: Secondary | ICD-10-CM | POA: Insufficient documentation

## 2020-05-06 DIAGNOSIS — H538 Other visual disturbances: Secondary | ICD-10-CM | POA: Insufficient documentation

## 2020-05-08 ENCOUNTER — Encounter: Payer: Self-pay | Admitting: Cardiology

## 2020-05-08 ENCOUNTER — Other Ambulatory Visit: Payer: Self-pay

## 2020-05-08 ENCOUNTER — Ambulatory Visit (INDEPENDENT_AMBULATORY_CARE_PROVIDER_SITE_OTHER): Payer: Medicaid Other | Admitting: Cardiology

## 2020-05-08 VITALS — BP 146/74 | HR 68 | Ht 75.0 in | Wt 235.0 lb

## 2020-05-08 DIAGNOSIS — I429 Cardiomyopathy, unspecified: Secondary | ICD-10-CM | POA: Diagnosis not present

## 2020-05-08 DIAGNOSIS — I2510 Atherosclerotic heart disease of native coronary artery without angina pectoris: Secondary | ICD-10-CM | POA: Diagnosis not present

## 2020-05-08 DIAGNOSIS — Z95828 Presence of other vascular implants and grafts: Secondary | ICD-10-CM

## 2020-05-08 DIAGNOSIS — I119 Hypertensive heart disease without heart failure: Secondary | ICD-10-CM

## 2020-05-08 MED ORDER — VALSARTAN 80 MG PO TABS: 80.0000 mg | ORAL_TABLET | Freq: Two times a day (BID) | ORAL | 3 refills | Status: DC

## 2020-05-08 NOTE — Progress Notes (Signed)
Cardiology Office Note:    Date:  05/08/2020   ID:  Timothy West, DOB 1960/12/07, MRN [id number]  PCP:  Olive Bass, MD  Cardiologist:  Norman Herrlich, MD    Referring MD: Olive Bass, MD    ASSESSMENT:    1. Cardiomyopathy, unspecified type (HCC)   2. CAD in native artery   3. Hypertensive heart disease without heart failure   4. S/P ascending aortic replacement    PLAN:    In order of problems listed above:  1. Fortunately is compliant with his medications BP at target and continue his current medical regimen.  Unfortunately he could not afford Entresto.  1 week before his next visit we will check echocardiogram if EF remains severely reduced will need to consider ICD therapy. 2. Stable following emergency surgery for aortic dissection and reimplantation of coronary artery 3. Recent studies reviewed stable anatomy   Next appointment: 3 months   Medication Adjustments/Labs and Tests Ordered: Current medicines are reviewed at length with the patient today.  Concerns regarding medicines are outlined above.  Orders Placed This Encounter  Procedures  . ECHOCARDIOGRAM COMPLETE   Meds ordered this encounter  Medications  . valsartan (DIOVAN) 80 MG tablet    Sig: Take 1 tablet (80 mg total) by mouth 2 (two) times daily.    Dispense:  180 tablet    Refill:  3    Chief Complaint  Patient presents with  . Follow-up  . Hypertension  . Cardiomyopathy    History of Present Illness:    Timothy West is a 59 y.o. 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  Other problems include hypertensive heart disease with severe LV dysfunction ejection fraction 35% and moderate aortic regurgitation noncompliance with medical therapy hyperlipidemia and ongoing cigarette smoking. He was last seen 02/05/2020 and was placed  back on vasodilator therapy with ARB as he could not afford an access Entresto.. Compliance with diet, lifestyle and medications: Yes  He was seen at [facility] ED 03/30/2020 with hypertension and visual change in the left eye blood pressure is 190/90.  Labs including BMP troponin CBC and renal function were all normal.  His EKG was described as nonspecific T waves.  I independently reviewed the EKG and it shows sinus rhythm with very typical LVH and repolarization.  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 Past Medical History:  Diagnosis Date  . Benign hypertension 09/10/2019  . Blurry vision, left eye   . CAD in native artery 07/09/2018  . Claudication in peripheral vascular disease (HCC) 09/04/2018  . Coronary artery disease   . Dyslipidemia 07/09/2018  . Fatigue 09/24/2019  . Headache   . Hip pain, acute, left 11/18/2019   Formatting of this note might be different from the original. 2021  . Hyperlipidemia 09/04/2018  . Hypertension   . Hypertensive heart disease 07/07/2018  .  LVH (left ventricular hypertrophy) 07/07/2018   severe  . Pressure injury of skin 06/21/2018  . S/P ascending aortic replacement 06/20/2018    Past Surgical History:  Procedure Laterality Date  . CORONARY ARTERY BYPASS GRAFT N/A 06/19/2018   Procedure: CORONARY ARTERY BYPASS GRAFTING (CABG) x1 WITH ENDOSCOPIC VEIN HARVEST (RIGHT THIGH). SVG TO THE RIGHT  CORONARY ARTERY.;  Surgeon: Delight Ovens, MD;  [facility] OR;  Service: Open Heart Surgery;  Laterality: N/A;  . INTRAOPERATIVE TRANSESOPHAGEAL ECHOCARDIOGRAM  06/19/2018   Procedure: INTRAOPERATIVE TRANSESOPHAGEAL ECHOCARDIOGRAM;  Surgeon: Delight Ovens, MD;  Location: [facility] OR;  Service: Open Heart Surgery;;  . REPLACEMENT ASCENDING AORTA N/A 06/19/2018   Procedure: REPAIR OF ACUTE TYPE I ASCENDING AORTIC DISSECTION. REPLACEMENT OF AORTA (HEMI ARCH) WITH  STRAIGHT HEMASHIELD PLATINUM GRAFT. RESUSPENSION OF AORTIC VALVE. HYPOTHERMIC CIRCULATORY ARREST WITH ANTEGRADE CEREBRAL PERFUSION.;  Surgeon: Delight Ovens, MD;  Location: [facility] OR;  Service: Open Heart Surgery;  Laterality: N/A;    Current Medications: Current Meds  Medication Sig  . amLODipine (NORVASC) 5 MG tablet Take 1 tablet (5 mg total) by mouth daily.  Marland Kitchen aspirin EC 81 MG tablet Take 81 mg by mouth daily.  . diclofenac (VOLTAREN) 75 MG EC tablet Take 75 mg by mouth 2 (two) times daily.  . famotidine-calcium carbonate-magnesium hydroxide (PEPCID COMPLETE) 10-800-165 MG chewable tablet Chew 1 tablet by mouth daily as needed.  . valsartan (DIOVAN) 80 MG tablet Take 1 tablet (80 mg total) by mouth 2 (two) times daily.  . [DISCONTINUED] valsartan (DIOVAN) 80 MG tablet Take 1 tablet (80 mg total) by mouth 2 (two) times daily.     Allergies:   Atorvastatin and Quinolones   Social History   Socioeconomic History  . Marital status: Single    Spouse name: Not on file  . Number of children: Not on file  . Years of education: Not on file  . Highest education level: Not on file  Occupational History  . Not on file  Tobacco Use  . Smoking status: Current Every Day Smoker    Packs/day: 2.00    Years: 30.00    Pack years: 60.00    Types: Cigarettes  . Smokeless tobacco: Never Used  Vaping Use  . Vaping Use: Never used  Substance and Sexual Activity  . Alcohol use: Yes  . Drug use: Not Currently  . Sexual activity:  Yes  Other Topics Concern  . Not on file  Social History Narrative  . Not on file   Social Determinants of Health   Financial Resource Strain:   . Difficulty of Paying Living Expenses: Not on file  Food Insecurity:   . Worried About Programme researcher, broadcasting/film/video in the Last Year: Not on file  . Ran Out of Food in the Last Year: Not on file  Transportation Needs:   . Lack of Transportation (Medical): Not on file  . Lack of Transportation (Non-Medical): Not on file  Physical Activity:   . Days of Exercise per Week: Not on file  . Minutes of Exercise per Session: Not on file  Stress:   . Feeling of Stress : Not on file  Social Connections:   . Frequency of Communication with Friends and Family: Not on file  . Frequency of Social Gatherings with Friends and Family: Not on file  . Attends Religious Services: Not on file  . Active Member of Clubs or Organizations: Not on file  . Attends Banker Meetings: Not on  file  . Marital Status: Not on file     Family History: The patient's family history includes Hypertension in his brother and mother; Thyroid disease in his mother. ROS:   Please see the history of present illness.    All other systems reviewed and are negative.  EKGs/Labs/Other Studies Reviewed:    The following studies were reviewed today:  Recent Labs: 02/05/2020: ALT 10 03/13/2020: BUN 10; Creatinine, Ser 1.12; NT-Pro BNP 797; Potassium 4.2; Sodium 138  Recent Lipid Panel    Component Value Date/Time   CHOL 157 02/05/2020 1440   TRIG 146 02/05/2020 1440   HDL 49 02/05/2020 1440   CHOLHDL 3.2 02/05/2020 1440   CHOLHDL 4.5 06/26/2018 0504   VLDL 17 06/26/2018 0504   LDLCALC 83 02/05/2020 1440    Physical Exam:    VS:  BP (!) 146/74   Pulse 68   Ht 6\' 3"  (1.905 m)   Wt 235 lb (106.6 kg)   SpO2 95%   BMI 29.37 kg/m     Wt Readings from Last 3 Encounters:  05/08/20 235 lb (106.6 kg)  03/13/20 237 lb (107.5 kg)  02/05/20 240 lb 9.6 oz (109.1 kg)       GEN:  Well nourished, well developed in no acute distress HEENT: Normal NECK: No JVD; No carotid bruits LYMPHATICS: No lymphadenopathy CARDIAC: 1/6 aortic regurgitation localized aortic area RRR, no rubs, gallops RESPIRATORY:  Clear to auscultation without rales, wheezing or rhonchi  ABDOMEN: Soft, non-tender, non-distended MUSCULOSKELETAL:  No edema; No deformity  SKIN: Warm and dry NEUROLOGIC:  Alert and oriented x 3 PSYCHIATRIC:  Normal affect    Signed, 02/07/20, MD  05/08/2020 3:19 PM    [facility]

## 2020-05-08 NOTE — Patient Instructions (Signed)
Medication Instructions:  Your physician recommends that you continue on your current medications as directed. Please refer to the Current Medication list given to you today.  *If you need a refill on your cardiac medications before your next appointment, please call your pharmacy*   Lab Work: None If you have labs (blood work) drawn today and your tests are completely normal, you will receive your results only by: . MyChart Message (if you have MyChart) OR . A paper copy in the mail If you have any lab test that is abnormal or we need to change your treatment, we will call you to review the results.   Testing/Procedures: Your physician has requested that you have an echocardiogram. Echocardiography is a painless test that uses sound waves to create images of your heart. It provides your doctor with information about the size and shape of your heart and how well your heart's chambers and valves are working. This procedure takes approximately one hour. There are no restrictions for this procedure.     Follow-Up: At [facility], you and your health needs are our priority.  As part of our continuing mission to provide you with exceptional heart care, we have created designated Provider Care Teams.  These Care Teams include your primary Cardiologist (physician) and Advanced Practice Providers (APPs -  Physician Assistants and Nurse Practitioners) who all work together to provide you with the care you need, when you need it.  We recommend signing up for the patient portal called "MyChart".  Sign up information is provided on this After Visit Summary.  MyChart is used to connect with patients for Virtual Visits (Telemedicine).  Patients are able to view lab/test results, encounter notes, upcoming appointments, etc.  Non-urgent messages can be sent to your provider as well.   To learn more about what you can do with MyChart, go to [email or web address].    Your next appointment:   3  month(s)  The format for your next appointment:   In Person  Provider:   Brian Munley, MD   Other Instructions   

## 2020-05-11 ENCOUNTER — Telehealth: Payer: Self-pay

## 2020-05-11 NOTE — Telephone Encounter (Signed)
Left message on patients voicemail with the details of this appointment with a request to please return our call to let us know that he has received this message.

## 2020-05-11 NOTE — Telephone Encounter (Signed)
-----   Message from Abelardo Diesel sent at 05/11/2020 11:19 AM EDT ----- R/S for 11/22 at 1:15 PM. Could you let him know, please?   Thanks,  Hermenia Bers  ----- Message ----- From: Felecia Jan, RN Sent: 05/08/2020   3:28 PM EDT To: Abelardo Diesel  Can we reschedule his echo appointment for 3 months out?  Thanks,  Lequita Halt, RN

## 2020-05-23 ENCOUNTER — Other Ambulatory Visit: Payer: Self-pay | Admitting: Cardiology

## 2020-05-29 ENCOUNTER — Other Ambulatory Visit: Payer: Medicaid Other

## 2020-08-10 ENCOUNTER — Other Ambulatory Visit: Payer: Medicaid Other

## 2020-08-21 ENCOUNTER — Ambulatory Visit: Payer: Medicaid Other | Admitting: Cardiology

## 2020-09-04 ENCOUNTER — Other Ambulatory Visit: Payer: Self-pay

## 2020-09-04 ENCOUNTER — Ambulatory Visit (INDEPENDENT_AMBULATORY_CARE_PROVIDER_SITE_OTHER): Payer: Medicaid Other

## 2020-09-04 ENCOUNTER — Other Ambulatory Visit: Payer: Medicaid Other

## 2020-09-04 DIAGNOSIS — I429 Cardiomyopathy, unspecified: Secondary | ICD-10-CM | POA: Diagnosis not present

## 2020-09-04 LAB — ECHOCARDIOGRAM COMPLETE
Area-P 1/2: 3.12 cm2
Calc EF: 45.9 %
P 1/2 time: 364 msec
S' Lateral: 3.8 cm
Single Plane A2C EF: 42 %
Single Plane A4C EF: 48.5 %

## 2020-09-04 NOTE — Progress Notes (Signed)
Complete echocardiogram performed.  Jimmy Breeze Angell RDCS, RVT  

## 2020-09-09 ENCOUNTER — Telehealth: Payer: Self-pay | Admitting: Cardiology

## 2020-09-09 NOTE — Telephone Encounter (Signed)
Pt called in returning call for recent Echo results   Best number - [phone]

## 2020-09-09 NOTE — Telephone Encounter (Signed)
Called patient informed him of results.  

## 2020-10-19 ENCOUNTER — Telehealth: Payer: Self-pay | Admitting: Cardiology

## 2020-10-19 MED ORDER — VALSARTAN 80 MG PO TABS
80.0000 mg | ORAL_TABLET | Freq: Two times a day (BID) | ORAL | 2 refills | Status: DC
Start: 2020-10-19 — End: 2021-08-27

## 2020-10-19 NOTE — Telephone Encounter (Signed)
*  STAT* If patient is at the pharmacy, call can be transferred to refill team.   1. Which medications need to be refilled? (please list name of each medication and dose if known) valsartan (DIOVAN) 80 MG tablet  2. Which pharmacy/location (including street and city if local pharmacy) is medication to be sent to? [id number] - [place], [place] - [address]  3. Do they need a 30 day or 90 day supply? 90 day  Patient is out of medication.

## 2020-10-20 ENCOUNTER — Telehealth: Payer: Self-pay | Admitting: Cardiology

## 2020-10-20 NOTE — Telephone Encounter (Signed)
New message:     Pharmacy calling concering valsartan they have 40 mg patient has been receiving and yesterday in was in creased to 80 mg. Please advise. Patient is at the pharmacy.

## 2020-10-20 NOTE — Telephone Encounter (Signed)
Spoke to the pharmacy just now and verified with them that the patient should be taking Valsartan 80 mg twice daily. This was started in June of 2021. She verbalizes understanding and thanks me for calling her back.

## 2021-03-19 ENCOUNTER — Other Ambulatory Visit: Payer: Self-pay | Admitting: Cardiology

## 2021-03-19 NOTE — Telephone Encounter (Signed)
Rx approved and sent with instruction to arrange an appt.  

## 2021-03-22 NOTE — Progress Notes (Signed)
Cardiology Office Note:    Date:  03/23/2021   ID:  Timothy West, DOB 26-Jun-1961, MRN [id number]  PCP:  Olive Bass, MD  Cardiologist:  Norman Herrlich, MD    Referring MD: Olive Bass, MD    ASSESSMENT:    1. TIA (transient ischemic attack)   2. Hypertension, unspecified type   3. Hypertensive heart disease without heart failure   4. S/P ascending aortic replacement    PLAN:    In order of problems listed above:  Recent presentation most consistent with TIA add clopidogrel to aspirin at start lipid-lowering therapy continue his current antihypertensive. BP is improved continue ARB previously intolerant of beta-blocker Stable anatomy recent CTA   Next appointment: 3 months   Medication Adjustments/Labs and Tests Ordered: Current medicines are reviewed at length with the patient today.  Concerns regarding medicines are outlined above.  Orders Placed This Encounter  Procedures   EKG 12-Lead   Meds ordered this encounter  Medications   clopidogrel (PLAVIX) 75 MG tablet    Sig: Take 1 tablet (75 mg total) by mouth daily.    Dispense:  90 tablet    Refill:  3   rosuvastatin (CRESTOR) 20 MG tablet    Sig: Take 1 tablet (20 mg total) by mouth daily.    Dispense:  90 tablet    Refill:  3    Chief Complaint  Patient presents with   Follow-up   Hypertension   Cardiomyopathy    History of Present Illness:    Timothy West is a 60 y.o. male with a hx of a complex cardiac history including type a thoracic aortic dissection with replacement of the ascending aorta resuspension of the aortic valve and saphenous vein bypass graft to the right coronary artery 06/20/2018 emergency surgery.  Other problems include hypertensive heart disease with severe LV dysfunction moderate aortic regurgitation noncompliance of medical therapy hyperlipidemia and ongoing cigarette smoking.  He was last seen 05/08/2020.Marland Kitchen  Compliance with diet, lifestyle and medications: Yes  I reviewed  his records with him. In retrospect I think he had a TIA of the day he was seen in the emergency room. I will have him start Plavix along with his aspirin and resume lipid-lowering therapy with a high intensity statin. Plan to see him back in the office in 3 months. No chest pain shortness of breath palpitation or syncope.  Echocardiogram performed 09/04/2020 showed moderate left ventricular dysfunction EF 40 to 45% with severe LVH elevated left atrial pressure normal right ventricular size function and pulmonary artery pressure mild to moderate aortic regurgitation.  The ascending aorta was 43 mm.  He was seen in [facility] 03/10/2021 complaining of right-sided pain and weakness.  Blood pressure 161/71 pulse 60 bpm.  Laboratory studies showed a hemoglobin of 16.2 creatinine 0.80 potassium 4.3.  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  CTA showed no intracranial large vessel occlusion or significant stenosis findings of thoracic aortic dissection were unchanged from prior studies.  The dissection extends into the innominate and right common carotid artery without stenosis.  He left the hospital AGAINST MEDICAL ADVICE.  His EKG I independently reviewed showed sinus rhythm nonspecific ST and T abnormality LVH voltage. Past Medical History:  Diagnosis Date   Benign hypertension 09/10/2019   Blurry vision, left eye    CAD in native artery 07/09/2018   Claudication in peripheral vascular disease (HCC) 09/04/2018   Coronary  artery disease    Dyslipidemia 07/09/2018   Fatigue 09/24/2019   Headache    Hip pain, acute, left 11/18/2019   Formatting of this note might be different from the original. 2021   Hyperlipidemia 09/04/2018   Hypertension    Hypertensive heart disease 07/07/2018   LVH (left ventricular hypertrophy) 07/07/2018   severe   Pressure injury of skin 06/21/2018   S/P ascending aortic replacement 06/20/2018     Past Surgical History:  Procedure Laterality Date   CORONARY ARTERY BYPASS GRAFT N/A 06/19/2018   Procedure: CORONARY ARTERY BYPASS GRAFTING (CABG) x1 WITH ENDOSCOPIC VEIN HARVEST (RIGHT THIGH). SVG TO THE RIGHT CORONARY ARTERY.;  Surgeon: Delight Ovens, MD;  Location: [facility] OR;  Service: Open Heart Surgery;  Laterality: N/A;   INTRAOPERATIVE TRANSESOPHAGEAL ECHOCARDIOGRAM  06/19/2018   Procedure: INTRAOPERATIVE TRANSESOPHAGEAL ECHOCARDIOGRAM;  Surgeon: Delight Ovens, MD;  Location: [facility] OR;  Service: Open Heart Surgery;;   REPLACEMENT ASCENDING AORTA N/A 06/19/2018   Procedure: REPAIR OF ACUTE TYPE I ASCENDING AORTIC DISSECTION. REPLACEMENT OF AORTA (HEMI ARCH) WITH  STRAIGHT HEMASHIELD PLATINUM GRAFT. RESUSPENSION OF AORTIC VALVE. HYPOTHERMIC CIRCULATORY ARREST WITH ANTEGRADE CEREBRAL PERFUSION.;  Surgeon: Delight Ovens, MD;  Location: [facility] OR;  Service: Open Heart Surgery;  Laterality: N/A;    Current Medications: Current Meds  Medication Sig   amLODipine (NORVASC) 5 MG tablet TAKE 1 TABLET(5 MG) BY MOUTH DAILY   aspirin EC 81 MG tablet Take 81 mg by mouth daily.   clopidogrel (PLAVIX) 75 MG tablet Take 1 tablet (75 mg total) by mouth daily.   diclofenac (VOLTAREN) 75 MG EC tablet Take 75 mg by mouth 2 (two) times daily.   famotidine-calcium carbonate-magnesium hydroxide (PEPCID COMPLETE) 10-800-165 MG chewable tablet Chew 1 tablet by mouth daily as needed (heart burn).   rosuvastatin (CRESTOR) 20 MG tablet Take 1 tablet (20 mg total) by mouth daily.   valsartan (DIOVAN) 80 MG tablet Take 1 tablet (80 mg total) by mouth 2 (two) times daily.     Allergies:   Atorvastatin and Quinolones   Social History   Socioeconomic History   Marital status: Single    Spouse name: Not on file   Number of children: Not on file   Years of education: Not on file   Highest education level: Not on file  Occupational History   Not on file  Tobacco Use   Smoking status: Every Day     Packs/day: 2.00    Years: 30.00    Pack years: 60.00    Types: Cigarettes   Smokeless tobacco: Never  Vaping Use   Vaping Use: Never used  Substance and Sexual Activity   Alcohol use: Yes   Drug use: Not Currently   Sexual activity: Yes  Other Topics Concern   Not on file  Social History Narrative   Not on file   Social Determinants of Health   Financial Resource Strain: Not on file  Food Insecurity: Not on file  Transportation Needs: Not on file  Physical Activity: Not on file  Stress: Not on file  Social Connections: Not on file     Family History: The patient's family history includes Hypertension in his brother and mother; Thyroid disease in his mother. ROS:   Please see the history of present illness.    All other systems reviewed and are negative.  EKGs/Labs/Other Studies Reviewed:    The following studies were reviewed today:  EKG:  EKG ordered today and personally reviewed.  The  ekg ordered today demonstrates sinus rhythm LVH voltage  Recent Labs: No results found for requested labs within last 8760 hours.  Recent Lipid Panel    Component Value Date/Time   CHOL 157 02/05/2020 1440   TRIG 146 02/05/2020 1440   HDL 49 02/05/2020 1440   CHOLHDL 3.2 02/05/2020 1440   CHOLHDL 4.5 06/26/2018 0504   VLDL 17 06/26/2018 0504   LDLCALC 83 02/05/2020 1440    Physical Exam:    VS:  BP 140/70 (BP Location: Right Arm, Patient Position: Sitting, Cuff Size: Normal)   Pulse 72   Ht 6\' 3"  (1.905 m)   Wt 225 lb (102.1 kg)   SpO2 94%   BMI 28.12 kg/m     Wt Readings from Last 3 Encounters:  03/23/21 225 lb (102.1 kg)  05/08/20 235 lb (106.6 kg)  03/13/20 237 lb (107.5 kg)     GEN:  Well nourished, well developed in no acute distress HEENT: Normal NECK: No JVD; No carotid bruits LYMPHATICS: No lymphadenopathy CARDIAC: RRR, no murmurs, rubs, gallops RESPIRATORY:  Clear to auscultation without rales, wheezing or rhonchi  ABDOMEN: Soft, non-tender,  non-distended MUSCULOSKELETAL:  No edema; No deformity  SKIN: Warm and dry NEUROLOGIC:  Alert and oriented x 3 PSYCHIATRIC:  Normal affect    Signed, 03/15/20, MD  03/23/2021 1:39 PM    [facility]

## 2021-03-23 ENCOUNTER — Ambulatory Visit (INDEPENDENT_AMBULATORY_CARE_PROVIDER_SITE_OTHER): Payer: Medicaid Other | Admitting: Cardiology

## 2021-03-23 ENCOUNTER — Encounter: Payer: Self-pay | Admitting: Cardiology

## 2021-03-23 ENCOUNTER — Other Ambulatory Visit: Payer: Self-pay

## 2021-03-23 VITALS — BP 140/70 | HR 72 | Ht 75.0 in | Wt 225.0 lb

## 2021-03-23 DIAGNOSIS — I10 Essential (primary) hypertension: Secondary | ICD-10-CM

## 2021-03-23 DIAGNOSIS — I119 Hypertensive heart disease without heart failure: Secondary | ICD-10-CM

## 2021-03-23 DIAGNOSIS — Z95828 Presence of other vascular implants and grafts: Secondary | ICD-10-CM

## 2021-03-23 DIAGNOSIS — G459 Transient cerebral ischemic attack, unspecified: Secondary | ICD-10-CM | POA: Diagnosis not present

## 2021-03-23 MED ORDER — CLOPIDOGREL BISULFATE 75 MG PO TABS: 75.0000 mg | ORAL_TABLET | Freq: Every day | ORAL | 3 refills | Status: AC

## 2021-03-23 MED ORDER — ROSUVASTATIN CALCIUM 20 MG PO TABS: 20.0000 mg | ORAL_TABLET | Freq: Every day | ORAL | 3 refills | Status: AC

## 2021-03-23 NOTE — Patient Instructions (Signed)
Medication Instructions:  Your physician has recommended you make the following change in your medication:  START: Plavix 75 mg take one tablet by mouth daily.  START: Rosuvastatin 20 mg take one tablet by mouth daily.  *If you need a refill on your cardiac medications before your next appointment, please call your pharmacy*   Lab Work: None If you have labs (blood work) drawn today and your tests are completely normal, you will receive your results only by: MyChart Message (if you have MyChart) OR A paper copy in the mail If you have any lab test that is abnormal or we need to change your treatment, we will call you to review the results.   Testing/Procedures: None   Follow-Up: At [facility], you and your health needs are our priority.  As part of our continuing mission to provide you with exceptional heart care, we have created designated Provider Care Teams.  These Care Teams include your primary Cardiologist (physician) and Advanced Practice Providers (APPs -  Physician Assistants and Nurse Practitioners) who all work together to provide you with the care you need, when you need it.  We recommend signing up for the patient portal called "MyChart".  Sign up information is provided on this After Visit Summary.  MyChart is used to connect with patients for Virtual Visits (Telemedicine).  Patients are able to view lab/test results, encounter notes, upcoming appointments, etc.  Non-urgent messages can be sent to your provider as well.   To learn more about what you can do with MyChart, go to [email or web address].    Your next appointment:   3 month(s)  The format for your next appointment:   In Person  Provider:   Norman Herrlich, MD   Other Instructions

## 2021-04-19 ENCOUNTER — Other Ambulatory Visit: Payer: Self-pay | Admitting: Cardiology

## 2021-04-19 NOTE — Telephone Encounter (Signed)
Rx sent 

## 2021-06-24 ENCOUNTER — Ambulatory Visit: Payer: Medicaid Other | Admitting: Cardiology

## 2021-06-24 NOTE — Progress Notes (Deleted)
Cardiology Office Note:    Date:  06/24/2021   ID:  Timothy West, DOB 07-01-1961, MRN [id number]  PCP:  Timothy Bass, MD  Cardiologist:  Timothy Herrlich, MD    Referring MD: Timothy Bass, MD    ASSESSMENT:    No diagnosis found. PLAN:    In order of problems listed above:  ***   Next appointment: ***   Medication Adjustments/Labs and Tests Ordered: Current medicines are reviewed at length with the patient today.  Concerns regarding medicines are outlined above.  No orders of the defined types were placed in this encounter.  No orders of the defined types were placed in this encounter.   No chief complaint on file.   History of Present Illness:    Timothy West is a 60 y.o. 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  Other problems include hypertensive heart disease with severe LV dysfunction moderate aortic regurgitation noncompliance of medical therapy hyperlipidemia and ongoing cigarette smoking.  He was last seen 03/23/2021 following a TIA seen in the emergency room department.  Clopidogrel was added to aspirin therapy. He was seen in [facility] 03/10/2021 complaining of right-sided pain and weakness.  Blood pressure 161/71 pulse 60 bpm.  Laboratory studies showed a hemoglobin of 16.2 creatinine 0.80 potassium 4.3.  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  CTA showed no intracranial large vessel occlusion or significant stenosis findings of thoracic aortic dissection were unchanged from prior studies.  The dissection extends into the innominate and right common carotid artery without stenosis.  He left the hospital AGAINST MEDICAL ADVICE.  His EKG I independently reviewed showed sinus  rhythm nonspecific ST and T abnormality LVH voltage.   Compliance with diet, lifestyle and medications: ***  Echocardiogram performed 09/04/2020 showed moderate left ventricular dysfunction EF 40 to 45% with severe LVH elevated left atrial pressure normal right ventricular size function and pulmonary artery pressure mild to moderate aortic regurgitation.  The ascending aorta was 43 mm.  Past Medical History:  Diagnosis Date   Benign hypertension 09/10/2019   Blurry vision, left eye    CAD in native artery 07/09/2018   Claudication in peripheral vascular disease (HCC) 09/04/2018   Coronary artery disease    Dyslipidemia 07/09/2018   Fatigue 09/24/2019   Headache    Hip pain, acute, left 11/18/2019   Formatting of this note might be different from the original. 2021   Hyperlipidemia 09/04/2018   Hypertension    Hypertensive heart disease 07/07/2018   LVH (left ventricular hypertrophy) 07/07/2018   severe   Pressure injury of skin 06/21/2018   S/P ascending aortic replacement 06/20/2018    Past Surgical History:  Procedure Laterality Date   CORONARY ARTERY BYPASS GRAFT N/A 06/19/2018   Procedure: CORONARY ARTERY BYPASS GRAFTING (CABG) x1 WITH ENDOSCOPIC VEIN HARVEST (RIGHT THIGH). SVG TO THE RIGHT CORONARY ARTERY.;  Surgeon: Timothy Ovens, MD;  Location: [facility] OR;  Service: Open Heart Surgery;  Laterality: N/A;   INTRAOPERATIVE TRANSESOPHAGEAL ECHOCARDIOGRAM  06/19/2018   Procedure: INTRAOPERATIVE TRANSESOPHAGEAL ECHOCARDIOGRAM;  Surgeon: Timothy Ovens, MD;  Location: [facility] OR;  Service: Open Heart Surgery;;   REPLACEMENT ASCENDING AORTA N/A 06/19/2018   Procedure: REPAIR OF ACUTE TYPE I ASCENDING AORTIC DISSECTION. REPLACEMENT OF AORTA (  HEMI ARCH) WITH  STRAIGHT HEMASHIELD PLATINUM GRAFT. RESUSPENSION OF AORTIC VALVE. HYPOTHERMIC CIRCULATORY ARREST WITH ANTEGRADE CEREBRAL PERFUSION.;  Surgeon: Timothy Ovens, MD;  Location: [facility] OR;  Service: Open Heart Surgery;  Laterality: N/A;     Current Medications: No outpatient medications have been marked as taking for the 06/24/21 encounter (Appointment) with Timothy Daub, MD.     Allergies:   Atorvastatin and Quinolones   Social History   Socioeconomic History   Marital status: Single    Spouse name: Not on file   Number of children: Not on file   Years of education: Not on file   Highest education level: Not on file  Occupational History   Not on file  Tobacco Use   Smoking status: Every Day    Packs/day: 2.00    Years: 30.00    Pack years: 60.00    Types: Cigarettes   Smokeless tobacco: Never  Vaping Use   Vaping Use: Never used  Substance and Sexual Activity   Alcohol use: Yes   Drug use: Not Currently   Sexual activity: Yes  Other Topics Concern   Not on file  Social History Narrative   Not on file   Social Determinants of Health   Financial Resource Strain: Not on file  Food Insecurity: Not on file  Transportation Needs: Not on file  Physical Activity: Not on file  Stress: Not on file  Social Connections: Not on file     Family History: The patient's ***family history includes Hypertension in his brother and mother; Thyroid disease in his mother. ROS:   Please see the history of present illness.    All other systems reviewed and are negative.  EKGs/Labs/Other Studies Reviewed:    The following studies were reviewed today:  EKG:  EKG ordered today and personally reviewed.  The ekg ordered today demonstrates ***  Recent Labs: No results found for requested labs within last 8760 hours.  Recent Lipid Panel    Component Value Date/Time   CHOL 157 02/05/2020 1440   TRIG 146 02/05/2020 1440   HDL 49 02/05/2020 1440   CHOLHDL 3.2 02/05/2020 1440   CHOLHDL 4.5 06/26/2018 0504   VLDL 17 06/26/2018 0504   LDLCALC 83 02/05/2020 1440    Physical Exam:    VS:  There were no vitals taken for this visit.    Wt Readings from Last 3 Encounters:  03/23/21 225 lb (102.1 kg)  05/08/20  235 lb (106.6 kg)  03/13/20 237 lb (107.5 kg)     GEN: *** Well nourished, well developed in no acute distress HEENT: Normal NECK: No JVD; No carotid bruits LYMPHATICS: No lymphadenopathy CARDIAC: ***RRR, no murmurs, rubs, gallops RESPIRATORY:  Clear to auscultation without rales, wheezing or rhonchi  ABDOMEN: Soft, non-tender, non-distended MUSCULOSKELETAL:  No edema; No deformity  SKIN: Warm and dry NEUROLOGIC:  Alert and oriented x 3 PSYCHIATRIC:  Normal affect    Signed, Timothy Herrlich, MD  06/24/2021 12:15 PM    [facility]

## 2021-07-20 ENCOUNTER — Other Ambulatory Visit: Payer: Self-pay | Admitting: Cardiology

## 2021-08-27 ENCOUNTER — Other Ambulatory Visit: Payer: Self-pay | Admitting: Cardiology

## 2021-10-20 DEATH — deceased
